# Patient Record
Sex: Female | Born: 1935 | Race: White | Hispanic: No | Marital: Married | Smoking: Never smoker
Health system: Southern US, Community
[De-identification: ages and names within clinical notes are randomized; demographics above are authoritative.]

## PROBLEM LIST (undated history)

## (undated) DIAGNOSIS — H269 Unspecified cataract: Secondary | ICD-10-CM

## (undated) DIAGNOSIS — F419 Anxiety disorder, unspecified: Secondary | ICD-10-CM

## (undated) DIAGNOSIS — F99 Mental disorder, not otherwise specified: Secondary | ICD-10-CM

## (undated) DIAGNOSIS — G709 Myoneural disorder, unspecified: Secondary | ICD-10-CM

## (undated) DIAGNOSIS — M1990 Unspecified osteoarthritis, unspecified site: Secondary | ICD-10-CM

## (undated) DIAGNOSIS — R51 Headache: Secondary | ICD-10-CM

## (undated) HISTORY — DX: Myoneural disorder, unspecified: G70.9

## (undated) HISTORY — PX: SPINE SURGERY: SHX786

## (undated) HISTORY — DX: Unspecified cataract: H26.9

## (undated) HISTORY — PX: CATARACT EXTRACTION, BILATERAL: SHX1313

## (undated) HISTORY — PX: KNEE SURGERY: SHX244

---

## 1998-03-29 ENCOUNTER — Other Ambulatory Visit: Admission: RE | Admit: 1998-03-29 | Discharge: 1998-03-29 | Payer: Self-pay | Admitting: Obstetrics and Gynecology

## 1999-01-04 ENCOUNTER — Ambulatory Visit (HOSPITAL_COMMUNITY): Admission: RE | Admit: 1999-01-04 | Discharge: 1999-01-04 | Payer: Self-pay | Admitting: Neurosurgery

## 1999-01-04 ENCOUNTER — Encounter: Payer: Self-pay | Admitting: Neurosurgery

## 1999-01-11 ENCOUNTER — Ambulatory Visit (HOSPITAL_COMMUNITY): Admission: RE | Admit: 1999-01-11 | Discharge: 1999-01-11 | Payer: Self-pay | Admitting: Neurosurgery

## 1999-02-20 ENCOUNTER — Ambulatory Visit (HOSPITAL_COMMUNITY): Admission: RE | Admit: 1999-02-20 | Discharge: 1999-02-20 | Payer: Self-pay | Admitting: Obstetrics and Gynecology

## 1999-02-20 ENCOUNTER — Encounter: Payer: Self-pay | Admitting: Obstetrics and Gynecology

## 1999-04-02 ENCOUNTER — Other Ambulatory Visit: Admission: RE | Admit: 1999-04-02 | Discharge: 1999-04-02 | Payer: Self-pay | Admitting: Obstetrics and Gynecology

## 2000-06-29 ENCOUNTER — Encounter: Payer: Self-pay | Admitting: Obstetrics and Gynecology

## 2000-06-29 ENCOUNTER — Ambulatory Visit (HOSPITAL_COMMUNITY): Admission: RE | Admit: 2000-06-29 | Discharge: 2000-06-29 | Payer: Self-pay | Admitting: Obstetrics and Gynecology

## 2000-07-02 ENCOUNTER — Encounter: Admission: RE | Admit: 2000-07-02 | Discharge: 2000-07-02 | Payer: Self-pay | Admitting: Obstetrics and Gynecology

## 2000-07-02 ENCOUNTER — Encounter: Payer: Self-pay | Admitting: Obstetrics and Gynecology

## 2000-07-09 ENCOUNTER — Other Ambulatory Visit: Admission: RE | Admit: 2000-07-09 | Discharge: 2000-07-09 | Payer: Self-pay | Admitting: Obstetrics and Gynecology

## 2000-07-20 ENCOUNTER — Encounter (INDEPENDENT_AMBULATORY_CARE_PROVIDER_SITE_OTHER): Payer: Self-pay | Admitting: Specialist

## 2000-07-20 ENCOUNTER — Ambulatory Visit (HOSPITAL_COMMUNITY): Admission: RE | Admit: 2000-07-20 | Discharge: 2000-07-20 | Payer: Self-pay | Admitting: Surgery

## 2001-08-26 ENCOUNTER — Encounter: Payer: Self-pay | Admitting: Surgery

## 2001-08-26 ENCOUNTER — Encounter: Admission: RE | Admit: 2001-08-26 | Discharge: 2001-08-26 | Payer: Self-pay | Admitting: Surgery

## 2001-08-27 ENCOUNTER — Ambulatory Visit (HOSPITAL_COMMUNITY): Admission: RE | Admit: 2001-08-27 | Discharge: 2001-08-27 | Payer: Self-pay | Admitting: Obstetrics and Gynecology

## 2001-08-27 ENCOUNTER — Encounter: Payer: Self-pay | Admitting: Obstetrics and Gynecology

## 2001-09-30 ENCOUNTER — Other Ambulatory Visit: Admission: RE | Admit: 2001-09-30 | Discharge: 2001-09-30 | Payer: Self-pay | Admitting: Obstetrics and Gynecology

## 2002-12-30 ENCOUNTER — Emergency Department (HOSPITAL_COMMUNITY): Admission: EM | Admit: 2002-12-30 | Discharge: 2002-12-30 | Payer: Self-pay | Admitting: Emergency Medicine

## 2003-01-02 ENCOUNTER — Encounter: Payer: Self-pay | Admitting: Neurosurgery

## 2003-01-02 ENCOUNTER — Ambulatory Visit (HOSPITAL_COMMUNITY): Admission: RE | Admit: 2003-01-02 | Discharge: 2003-01-02 | Payer: Self-pay | Admitting: Neurosurgery

## 2003-01-17 ENCOUNTER — Inpatient Hospital Stay (HOSPITAL_COMMUNITY): Admission: RE | Admit: 2003-01-17 | Discharge: 2003-01-19 | Payer: Self-pay | Admitting: Neurosurgery

## 2003-01-17 ENCOUNTER — Encounter: Payer: Self-pay | Admitting: Neurosurgery

## 2003-03-20 ENCOUNTER — Encounter: Admission: RE | Admit: 2003-03-20 | Discharge: 2003-03-20 | Payer: Self-pay | Admitting: Neurosurgery

## 2003-03-20 ENCOUNTER — Encounter: Payer: Self-pay | Admitting: Neurosurgery

## 2003-03-20 ENCOUNTER — Encounter: Payer: Self-pay | Admitting: Radiology

## 2003-03-31 ENCOUNTER — Encounter: Payer: Self-pay | Admitting: Neurosurgery

## 2003-03-31 ENCOUNTER — Encounter: Admission: RE | Admit: 2003-03-31 | Discharge: 2003-03-31 | Payer: Self-pay | Admitting: Neurosurgery

## 2003-05-01 ENCOUNTER — Encounter: Payer: Self-pay | Admitting: Neurosurgery

## 2003-05-01 ENCOUNTER — Encounter: Admission: RE | Admit: 2003-05-01 | Discharge: 2003-05-01 | Payer: Self-pay | Admitting: Neurosurgery

## 2003-06-18 ENCOUNTER — Ambulatory Visit (HOSPITAL_COMMUNITY): Admission: RE | Admit: 2003-06-18 | Discharge: 2003-06-18 | Payer: Self-pay | Admitting: Neurosurgery

## 2003-06-18 ENCOUNTER — Encounter: Payer: Self-pay | Admitting: Neurosurgery

## 2003-06-23 ENCOUNTER — Encounter: Payer: Self-pay | Admitting: Neurosurgery

## 2003-06-23 ENCOUNTER — Inpatient Hospital Stay (HOSPITAL_COMMUNITY): Admission: RE | Admit: 2003-06-23 | Discharge: 2003-06-29 | Payer: Self-pay | Admitting: Neurosurgery

## 2003-10-31 ENCOUNTER — Other Ambulatory Visit: Admission: RE | Admit: 2003-10-31 | Discharge: 2003-10-31 | Payer: Self-pay | Admitting: Obstetrics and Gynecology

## 2003-12-14 ENCOUNTER — Encounter: Admission: RE | Admit: 2003-12-14 | Discharge: 2003-12-14 | Payer: Self-pay | Admitting: Geriatric Medicine

## 2005-04-07 ENCOUNTER — Ambulatory Visit (HOSPITAL_COMMUNITY): Admission: RE | Admit: 2005-04-07 | Discharge: 2005-04-07 | Payer: Self-pay | Admitting: Neurosurgery

## 2005-04-25 ENCOUNTER — Encounter: Admission: RE | Admit: 2005-04-25 | Discharge: 2005-04-25 | Payer: Self-pay | Admitting: Neurosurgery

## 2005-05-19 ENCOUNTER — Encounter: Admission: RE | Admit: 2005-05-19 | Discharge: 2005-05-19 | Payer: Self-pay | Admitting: Neurosurgery

## 2005-06-18 ENCOUNTER — Encounter: Admission: RE | Admit: 2005-06-18 | Discharge: 2005-06-18 | Payer: Self-pay | Admitting: Neurosurgery

## 2005-08-14 ENCOUNTER — Encounter: Admission: RE | Admit: 2005-08-14 | Discharge: 2005-08-14 | Payer: Self-pay | Admitting: Neurosurgery

## 2005-10-02 ENCOUNTER — Encounter
Admission: RE | Admit: 2005-10-02 | Discharge: 2005-12-31 | Payer: Self-pay | Admitting: Physical Medicine & Rehabilitation

## 2005-10-02 ENCOUNTER — Ambulatory Visit: Payer: Self-pay | Admitting: Physical Medicine & Rehabilitation

## 2005-12-26 ENCOUNTER — Encounter
Admission: RE | Admit: 2005-12-26 | Discharge: 2006-03-26 | Payer: Self-pay | Admitting: Physical Medicine & Rehabilitation

## 2005-12-26 ENCOUNTER — Ambulatory Visit: Payer: Self-pay | Admitting: Physical Medicine & Rehabilitation

## 2006-01-30 ENCOUNTER — Ambulatory Visit: Payer: Self-pay | Admitting: Physical Medicine & Rehabilitation

## 2006-02-27 ENCOUNTER — Ambulatory Visit: Payer: Self-pay | Admitting: Physical Medicine & Rehabilitation

## 2006-03-27 ENCOUNTER — Encounter
Admission: RE | Admit: 2006-03-27 | Discharge: 2006-04-06 | Payer: Self-pay | Admitting: Physical Medicine & Rehabilitation

## 2006-04-15 ENCOUNTER — Ambulatory Visit: Payer: Self-pay | Admitting: Physical Medicine & Rehabilitation

## 2006-04-15 ENCOUNTER — Encounter
Admission: RE | Admit: 2006-04-15 | Discharge: 2006-07-14 | Payer: Self-pay | Admitting: Physical Medicine & Rehabilitation

## 2006-05-27 ENCOUNTER — Ambulatory Visit: Payer: Self-pay | Admitting: Physical Medicine & Rehabilitation

## 2006-07-03 ENCOUNTER — Ambulatory Visit: Payer: Self-pay | Admitting: Physical Medicine & Rehabilitation

## 2006-09-02 ENCOUNTER — Ambulatory Visit: Payer: Self-pay | Admitting: Physical Medicine & Rehabilitation

## 2006-09-02 ENCOUNTER — Encounter
Admission: RE | Admit: 2006-09-02 | Discharge: 2006-12-01 | Payer: Self-pay | Admitting: Physical Medicine & Rehabilitation

## 2006-10-29 ENCOUNTER — Ambulatory Visit: Payer: Self-pay | Admitting: Physical Medicine and Rehabilitation

## 2006-10-29 ENCOUNTER — Encounter
Admission: RE | Admit: 2006-10-29 | Discharge: 2007-01-27 | Payer: Self-pay | Admitting: Physical Medicine and Rehabilitation

## 2006-11-25 ENCOUNTER — Encounter
Admission: RE | Admit: 2006-11-25 | Discharge: 2007-02-23 | Payer: Self-pay | Admitting: Physical Medicine & Rehabilitation

## 2006-12-14 ENCOUNTER — Ambulatory Visit: Payer: Self-pay | Admitting: Physical Medicine & Rehabilitation

## 2007-03-10 ENCOUNTER — Encounter
Admission: RE | Admit: 2007-03-10 | Discharge: 2007-06-08 | Payer: Self-pay | Admitting: Physical Medicine & Rehabilitation

## 2007-03-11 ENCOUNTER — Ambulatory Visit: Payer: Self-pay | Admitting: Physical Medicine & Rehabilitation

## 2007-06-03 ENCOUNTER — Ambulatory Visit: Payer: Self-pay | Admitting: Physical Medicine & Rehabilitation

## 2007-06-30 ENCOUNTER — Encounter
Admission: RE | Admit: 2007-06-30 | Discharge: 2007-09-28 | Payer: Self-pay | Admitting: Physical Medicine & Rehabilitation

## 2007-07-26 ENCOUNTER — Ambulatory Visit: Payer: Self-pay | Admitting: Physical Medicine & Rehabilitation

## 2007-08-03 ENCOUNTER — Ambulatory Visit (HOSPITAL_BASED_OUTPATIENT_CLINIC_OR_DEPARTMENT_OTHER): Admission: RE | Admit: 2007-08-03 | Discharge: 2007-08-03 | Payer: Self-pay | Admitting: Surgery

## 2007-08-03 ENCOUNTER — Encounter (INDEPENDENT_AMBULATORY_CARE_PROVIDER_SITE_OTHER): Payer: Self-pay | Admitting: Surgery

## 2007-10-22 ENCOUNTER — Ambulatory Visit: Payer: Self-pay | Admitting: Physical Medicine & Rehabilitation

## 2007-10-22 ENCOUNTER — Encounter
Admission: RE | Admit: 2007-10-22 | Discharge: 2008-01-20 | Payer: Self-pay | Admitting: Physical Medicine & Rehabilitation

## 2007-12-23 ENCOUNTER — Ambulatory Visit: Payer: Self-pay | Admitting: Physical Medicine & Rehabilitation

## 2007-12-29 ENCOUNTER — Encounter
Admission: RE | Admit: 2007-12-29 | Discharge: 2008-03-28 | Payer: Self-pay | Admitting: Physical Medicine & Rehabilitation

## 2008-01-21 ENCOUNTER — Ambulatory Visit: Payer: Self-pay | Admitting: Physical Medicine & Rehabilitation

## 2008-02-17 ENCOUNTER — Ambulatory Visit: Payer: Self-pay | Admitting: Physical Medicine & Rehabilitation

## 2008-03-27 ENCOUNTER — Encounter: Admission: RE | Admit: 2008-03-27 | Discharge: 2008-03-27 | Payer: Self-pay | Admitting: Rheumatology

## 2008-04-24 ENCOUNTER — Encounter: Admission: RE | Admit: 2008-04-24 | Discharge: 2008-04-24 | Payer: Self-pay | Admitting: Obstetrics and Gynecology

## 2008-05-11 ENCOUNTER — Encounter
Admission: RE | Admit: 2008-05-11 | Discharge: 2008-07-06 | Payer: Self-pay | Admitting: Physical Medicine & Rehabilitation

## 2008-05-12 ENCOUNTER — Ambulatory Visit: Payer: Self-pay | Admitting: Physical Medicine & Rehabilitation

## 2008-05-18 ENCOUNTER — Encounter: Admission: RE | Admit: 2008-05-18 | Discharge: 2008-05-18 | Payer: Self-pay | Admitting: Orthopedic Surgery

## 2008-05-23 ENCOUNTER — Ambulatory Visit (HOSPITAL_BASED_OUTPATIENT_CLINIC_OR_DEPARTMENT_OTHER): Admission: RE | Admit: 2008-05-23 | Discharge: 2008-05-23 | Payer: Self-pay | Admitting: Orthopedic Surgery

## 2008-07-06 ENCOUNTER — Ambulatory Visit: Payer: Self-pay | Admitting: Physical Medicine & Rehabilitation

## 2008-08-03 ENCOUNTER — Encounter
Admission: RE | Admit: 2008-08-03 | Discharge: 2008-11-01 | Payer: Self-pay | Admitting: Physical Medicine & Rehabilitation

## 2008-08-07 ENCOUNTER — Ambulatory Visit: Payer: Self-pay | Admitting: Physical Medicine & Rehabilitation

## 2008-09-05 ENCOUNTER — Ambulatory Visit: Payer: Self-pay | Admitting: Physical Medicine & Rehabilitation

## 2008-10-02 ENCOUNTER — Ambulatory Visit: Payer: Self-pay | Admitting: Physical Medicine & Rehabilitation

## 2008-10-30 ENCOUNTER — Encounter
Admission: RE | Admit: 2008-10-30 | Discharge: 2009-01-28 | Payer: Self-pay | Admitting: Physical Medicine & Rehabilitation

## 2008-10-31 ENCOUNTER — Ambulatory Visit: Payer: Self-pay | Admitting: Physical Medicine & Rehabilitation

## 2008-11-29 ENCOUNTER — Ambulatory Visit: Payer: Self-pay | Admitting: Physical Medicine & Rehabilitation

## 2008-12-26 ENCOUNTER — Ambulatory Visit: Payer: Self-pay | Admitting: Physical Medicine & Rehabilitation

## 2009-03-06 ENCOUNTER — Encounter
Admission: RE | Admit: 2009-03-06 | Discharge: 2009-06-04 | Payer: Self-pay | Admitting: Physical Medicine & Rehabilitation

## 2009-03-08 ENCOUNTER — Ambulatory Visit: Payer: Self-pay | Admitting: Physical Medicine & Rehabilitation

## 2009-04-06 ENCOUNTER — Ambulatory Visit: Payer: Self-pay | Admitting: Physical Medicine & Rehabilitation

## 2009-05-04 ENCOUNTER — Ambulatory Visit: Payer: Self-pay | Admitting: Physical Medicine & Rehabilitation

## 2009-06-05 ENCOUNTER — Encounter
Admission: RE | Admit: 2009-06-05 | Discharge: 2009-09-03 | Payer: Self-pay | Admitting: Physical Medicine & Rehabilitation

## 2009-06-11 ENCOUNTER — Ambulatory Visit: Payer: Self-pay | Admitting: Physical Medicine & Rehabilitation

## 2009-07-17 ENCOUNTER — Ambulatory Visit: Payer: Self-pay | Admitting: Physical Medicine & Rehabilitation

## 2009-07-21 ENCOUNTER — Observation Stay (HOSPITAL_COMMUNITY): Admission: EM | Admit: 2009-07-21 | Discharge: 2009-07-22 | Payer: Self-pay | Admitting: Emergency Medicine

## 2009-08-15 ENCOUNTER — Ambulatory Visit: Payer: Self-pay | Admitting: Physical Medicine & Rehabilitation

## 2009-09-11 ENCOUNTER — Ambulatory Visit: Payer: Self-pay | Admitting: Physical Medicine & Rehabilitation

## 2009-09-11 ENCOUNTER — Encounter
Admission: RE | Admit: 2009-09-11 | Discharge: 2009-10-18 | Payer: Self-pay | Admitting: Physical Medicine & Rehabilitation

## 2009-10-10 ENCOUNTER — Ambulatory Visit: Payer: Self-pay | Admitting: Physical Medicine & Rehabilitation

## 2009-10-18 ENCOUNTER — Ambulatory Visit: Payer: Self-pay | Admitting: Physical Medicine & Rehabilitation

## 2009-11-13 ENCOUNTER — Encounter
Admission: RE | Admit: 2009-11-13 | Discharge: 2010-02-11 | Payer: Self-pay | Admitting: Physical Medicine & Rehabilitation

## 2009-11-23 ENCOUNTER — Ambulatory Visit: Payer: Self-pay | Admitting: Physical Medicine & Rehabilitation

## 2009-12-21 ENCOUNTER — Ambulatory Visit: Payer: Self-pay | Admitting: Physical Medicine & Rehabilitation

## 2010-01-30 ENCOUNTER — Ambulatory Visit: Payer: Self-pay | Admitting: Physical Medicine & Rehabilitation

## 2010-02-25 ENCOUNTER — Encounter
Admission: RE | Admit: 2010-02-25 | Discharge: 2010-05-26 | Payer: Self-pay | Admitting: Physical Medicine & Rehabilitation

## 2010-02-27 ENCOUNTER — Ambulatory Visit: Payer: Self-pay | Admitting: Physical Medicine & Rehabilitation

## 2010-03-22 ENCOUNTER — Ambulatory Visit: Payer: Self-pay | Admitting: Physical Medicine & Rehabilitation

## 2010-04-17 ENCOUNTER — Ambulatory Visit: Payer: Self-pay | Admitting: Physical Medicine & Rehabilitation

## 2010-05-15 ENCOUNTER — Ambulatory Visit: Payer: Self-pay | Admitting: Physical Medicine & Rehabilitation

## 2010-05-30 ENCOUNTER — Encounter
Admission: RE | Admit: 2010-05-30 | Discharge: 2010-08-28 | Payer: Self-pay | Admitting: Physical Medicine & Rehabilitation

## 2010-06-06 ENCOUNTER — Ambulatory Visit: Payer: Self-pay | Admitting: Physical Medicine & Rehabilitation

## 2010-07-03 ENCOUNTER — Ambulatory Visit: Payer: Self-pay | Admitting: Physical Medicine & Rehabilitation

## 2010-07-31 ENCOUNTER — Ambulatory Visit: Payer: Self-pay | Admitting: Physical Medicine & Rehabilitation

## 2010-08-30 ENCOUNTER — Encounter
Admission: RE | Admit: 2010-08-30 | Discharge: 2010-11-19 | Payer: Self-pay | Source: Home / Self Care | Attending: Physical Medicine & Rehabilitation | Admitting: Physical Medicine & Rehabilitation

## 2010-09-03 ENCOUNTER — Ambulatory Visit: Payer: Self-pay | Admitting: Physical Medicine & Rehabilitation

## 2010-09-30 ENCOUNTER — Ambulatory Visit: Payer: Self-pay | Admitting: Physical Medicine & Rehabilitation

## 2010-10-02 ENCOUNTER — Ambulatory Visit: Payer: Self-pay | Admitting: Physical Medicine & Rehabilitation

## 2010-10-28 ENCOUNTER — Encounter
Admission: RE | Admit: 2010-10-28 | Discharge: 2010-10-28 | Payer: Self-pay | Source: Home / Self Care | Attending: Obstetrics and Gynecology | Admitting: Obstetrics and Gynecology

## 2010-11-06 ENCOUNTER — Encounter
Admission: RE | Admit: 2010-11-06 | Discharge: 2010-11-19 | Payer: Self-pay | Source: Home / Self Care | Attending: Physical Medicine & Rehabilitation | Admitting: Physical Medicine & Rehabilitation

## 2010-11-11 ENCOUNTER — Encounter: Payer: Self-pay | Admitting: Obstetrics and Gynecology

## 2010-12-16 ENCOUNTER — Encounter: Payer: MEDICARE | Attending: Physical Medicine & Rehabilitation

## 2010-12-16 ENCOUNTER — Encounter (HOSPITAL_BASED_OUTPATIENT_CLINIC_OR_DEPARTMENT_OTHER): Payer: MEDICARE | Admitting: Physical Medicine & Rehabilitation

## 2010-12-16 DIAGNOSIS — M47817 Spondylosis without myelopathy or radiculopathy, lumbosacral region: Secondary | ICD-10-CM | POA: Insufficient documentation

## 2010-12-16 DIAGNOSIS — Z981 Arthrodesis status: Secondary | ICD-10-CM | POA: Insufficient documentation

## 2010-12-25 NOTE — Procedures (Signed)
Shirley Hamilton, Shirley Hamilton                ACCOUNT NO.:  [id number]  MEDICAL RECORD NO.:  [id number]           PATIENT TYPE:  LOCATION:                                 FACILITY:  PHYSICIAN:  Erick Colace, M.D.DATE OF BIRTH:  18-Jan-1936  DATE OF PROCEDURE:  12/16/2010 DATE OF DISCHARGE:                              OPERATIVE REPORT  PROCEDURE:  Right T12, L1, L2 medial branch radiofrequency neurotomy under fluoroscopic guidance.  INDICATIONS:  Lumbosacral spondylosis above the level of L4-5, L5-S1 fusions.  Pain is only partially relieved by medication management including narcotic analgesics and this raised 8/10.  Informed consent was obtained after describing the risks and benefits of the procedure with the patient.  These include bleeding, bruising and infection.  She elects to proceed and has given written consent.  The patient was placed prone on fluoroscopy table.  Betadine prep, sterile draped.  A 25-gauge 1-1/2-inch needle was used to anesthetize the skin and subcutaneous tissue with 1% lidocaine x2 mL at each of 3 sites. Then a 28-gauge 10-cm RF needle with 10-mm curved active tip was inserted under fluoroscopic guidance first targeting the L3 SAP transverse process junction, bone contact made confirmed with lateral imaging.  Sensory stem at 50 Hz followed by motor stem at 2 Hz confirmed proper needle location followed by injection of 1 mL of the dexamethasone-lidocaine solution and radiofrequency lesioning 70 degrees Celsius for 70 seconds.  Then the right L2 SAP transverse process junction targeted, bone contact made confirmed with lateral imaging. Sensory stem at 50 Hz followed by motor stem at 2 Hz confirmed proper needle location, followed by injection of 1 mL of a dexamethasone- lidocaine solution and radiofrequency lesioning at 70 degrees Celsius for 70 seconds.  Then the right L1 SAP transverse process junction targeted, bone contact made confirmed with lateral  imaging.  Sensory stem at 50 Hz followed by motor stem at 2 Hz confirmed proper needle location, followed by injection of 1 mL of dexamethasone-lidocaine solution and radiofrequency lesioning at 70 degrees Celsius for 70 seconds.  The patient tolerated the procedure well.  Pre- and post- injection and vitals stable.  Post-injection instructions given.     Erick Colace, M.D. Electronically Signed    AEK/MEDQ  D:  12/16/2010 12:29:26  T:  12/16/2010 20:31:47  Job:  [id number]

## 2010-12-26 ENCOUNTER — Ambulatory Visit (HOSPITAL_BASED_OUTPATIENT_CLINIC_OR_DEPARTMENT_OTHER): Payer: MEDICARE | Admitting: Physical Medicine & Rehabilitation

## 2010-12-26 ENCOUNTER — Encounter: Payer: MEDICARE | Attending: Physical Medicine & Rehabilitation

## 2010-12-26 DIAGNOSIS — M47817 Spondylosis without myelopathy or radiculopathy, lumbosacral region: Secondary | ICD-10-CM | POA: Insufficient documentation

## 2010-12-26 DIAGNOSIS — Z981 Arthrodesis status: Secondary | ICD-10-CM | POA: Insufficient documentation

## 2010-12-26 DIAGNOSIS — M961 Postlaminectomy syndrome, not elsewhere classified: Secondary | ICD-10-CM

## 2010-12-26 DIAGNOSIS — IMO0002 Reserved for concepts with insufficient information to code with codable children: Secondary | ICD-10-CM

## 2011-01-02 ENCOUNTER — Other Ambulatory Visit: Payer: Self-pay | Admitting: Physical Medicine & Rehabilitation

## 2011-01-02 DIAGNOSIS — M79651 Pain in right thigh: Secondary | ICD-10-CM

## 2011-01-02 DIAGNOSIS — M545 Low back pain: Secondary | ICD-10-CM

## 2011-01-08 ENCOUNTER — Ambulatory Visit
Admission: RE | Admit: 2011-01-08 | Discharge: 2011-01-08 | Disposition: A | Discharge status: Home / Self Care | Payer: MEDICARE | Source: Ambulatory Visit | Attending: Physical Medicine & Rehabilitation | Admitting: Physical Medicine & Rehabilitation

## 2011-01-08 DIAGNOSIS — M545 Low back pain: Secondary | ICD-10-CM

## 2011-01-08 DIAGNOSIS — M79651 Pain in right thigh: Secondary | ICD-10-CM

## 2011-01-08 MED ORDER — GADOBENATE DIMEGLUMINE 529 MG/ML IV SOLN
10.0000 mL | Freq: Once | INTRAVENOUS | Status: AC | PRN
  Administered 2011-01-08: 10 mL via INTRAVENOUS

## 2011-01-10 NOTE — Assessment & Plan Note (Signed)
REASON FOR VISIT:  Right thigh pain.  HISTORY:  A 75 year old female with prior history of L4-5, L5-S1 fusion. This was performed approximately 10 years ago.  Over the last month, she has developed increasing right thigh burning pain.  She describes her pain as worsening with laying down primarily.  In the past, she has had trochanteric bursitis which improved after injection.  She feels like this pain is on the same side but not in the same location.  She gets some relief from our oxycodone but not complete.  It does interfere with her sleep.  She needs assistance with meal prep, but is otherwise independent.  She has been helping her husband more now that he has been debilitated from Parkinson disease.  REVIEW OF SYSTEMS:  As noted above, numbness and tingling, right thigh, she has this pain when attempting to sleep.  SOCIAL HISTORY:  Married, lives with her husband.  Drinks alcohol about 2 times per week.  PHYSICAL EXAMINATION:  GENERAL:  No acute distress.  Mood and affect appropriate. MUSCULOSKELETAL:  She has normal gait pattern.  Her deep tendon reflexes are normal, bilateral patella and Achilles.  She has negative femoral stretch test. Negative straight leg raising test.  She has decreased sensation on the lateral aspect of her thigh but this is not where her pain is.  She has no decreased sensation along the anterior or medial area of the thigh.  Her back has some tenderness to range of motion.  IMPRESSION: 1. Right thigh pain, burning, appears to be neuropathic in nature.     Differential includes L3 radiculopathy, meralgia paresthetica.  I     examined her hip.  She really does not have any tenderness over the     trochanteric bursa, so I really think there is no reason to inject     it today.  PLAN: 1. We will set her up for MRI, it has at least 7 years since her last     MRI. 2. Initiate gabapentin.  Given that this burning pain only bothers her     at night, we  will start at bedtime only.  Workup from 100 to 300 at     bedtime.  I will see her back in about 3 weeks to go over the MRI     results, possible epidural steroid injection at that time.     Discussed with the patient, agrees with plan.     Erick Colace, M.D. Electronically Signed    AEK/MedQ D:  12/26/2010 13:02:12  T:  12/26/2010 20:17:02  Job #:  [id number]  cc:   Hal T. Stoneking, M.D. Fax: [phone]

## 2011-01-16 ENCOUNTER — Encounter: Payer: MEDICARE | Admitting: Physical Medicine & Rehabilitation

## 2011-01-20 ENCOUNTER — Encounter (HOSPITAL_BASED_OUTPATIENT_CLINIC_OR_DEPARTMENT_OTHER): Payer: MEDICARE | Admitting: Physical Medicine & Rehabilitation

## 2011-01-20 ENCOUNTER — Encounter: Payer: MEDICARE | Attending: Physical Medicine & Rehabilitation

## 2011-01-20 DIAGNOSIS — M47817 Spondylosis without myelopathy or radiculopathy, lumbosacral region: Secondary | ICD-10-CM | POA: Insufficient documentation

## 2011-01-20 DIAGNOSIS — Z981 Arthrodesis status: Secondary | ICD-10-CM | POA: Insufficient documentation

## 2011-01-23 LAB — CBC
HCT: 40.3 % (ref 36.0–46.0)
Hemoglobin: 13.4 g/dL (ref 12.0–15.0)
MCHC: 33.2 g/dL (ref 30.0–36.0)
MCV: 98 fL (ref 78.0–100.0)
Platelets: 243 10*3/uL (ref 150–400)
RBC: 4.11 MIL/uL (ref 3.87–5.11)
RDW: 13 % (ref 11.5–15.5)
WBC: 9.4 10*3/uL (ref 4.0–10.5)

## 2011-01-23 LAB — COMPREHENSIVE METABOLIC PANEL
ALT: 20 U/L (ref 0–35)
AST: 23 U/L (ref 0–37)
Albumin: 3.1 g/dL — ABNORMAL LOW (ref 3.5–5.2)
Alkaline Phosphatase: 49 U/L (ref 39–117)
BUN: 4 mg/dL — ABNORMAL LOW (ref 6–23)
CO2: 25 mEq/L (ref 19–32)
Calcium: 8.4 mg/dL (ref 8.4–10.5)
Chloride: 106 mEq/L (ref 96–112)
Creatinine, Ser: 0.69 mg/dL (ref 0.4–1.2)
GFR calc Af Amer: >60 mL/min (ref >60)
GFR calc non Af Amer: >60 mL/min (ref >60)
Glucose, Bld: 207 mg/dL — ABNORMAL HIGH (ref 70–99)
Potassium: 3.6 mEq/L (ref 3.5–5.1)
Sodium: 139 mEq/L (ref 135–145)
Total Bilirubin: 0.4 mg/dL (ref 0.3–1.2)
Total Protein: 5.7 g/dL — ABNORMAL LOW (ref 6.0–8.3)

## 2011-01-23 LAB — SEDIMENTATION RATE: Sed Rate: 10 mm/hr (ref 0–22)

## 2011-02-24 ENCOUNTER — Encounter: Payer: MEDICARE | Attending: Physical Medicine & Rehabilitation

## 2011-02-24 ENCOUNTER — Ambulatory Visit: Payer: MEDICARE | Admitting: Physical Medicine & Rehabilitation

## 2011-02-24 ENCOUNTER — Ambulatory Visit (HOSPITAL_BASED_OUTPATIENT_CLINIC_OR_DEPARTMENT_OTHER): Payer: MEDICARE | Admitting: Physical Medicine & Rehabilitation

## 2011-02-24 DIAGNOSIS — Z981 Arthrodesis status: Secondary | ICD-10-CM | POA: Insufficient documentation

## 2011-02-24 DIAGNOSIS — IMO0002 Reserved for concepts with insufficient information to code with codable children: Secondary | ICD-10-CM

## 2011-02-24 DIAGNOSIS — M47817 Spondylosis without myelopathy or radiculopathy, lumbosacral region: Secondary | ICD-10-CM | POA: Insufficient documentation

## 2011-02-25 NOTE — Procedures (Signed)
NAMEKATERI, BALCH                ACCOUNT NO.:  [id number]  MEDICAL RECORD NO.:  [id number]           PATIENT TYPE:  LOCATION:                                 FACILITY:  PHYSICIAN:  Erick Colace, M.D.DATE OF BIRTH:  1936-10-07  DATE OF PROCEDURE:  02/24/2011 DATE OF DISCHARGE:                              OPERATIVE REPORT  PROCEDURE:  Right L3-4 transforaminal lumbar epidural steroid injection under fluoroscopic guidance.  INDICATIONS:  Right thigh pain mainly when trying to sleep, only partially relieved by medication management and other conservative care.  Informed consent was obtained after describing risks and benefits of the procedure with the patient.  These include bleeding, bruising and infection.  She elects to proceed and has given written consent.  The patient was placed prone on fluoroscopy table.  Betadine prep, sterile drape, 25-gauge inch and half needle was used to anesthetize the skin and subcutaneous tissue, 1% lidocaine x2 mL, then a 22-gauge three and half spinal needle was inserted into right L3-4 intervertebral foramen under AP, lateral, and oblique imaging.  Omnipaque 180 under live fluoro demonstrated good epidural and nerve root outline followed by injection of 1 mL of 10 mg/mL of dexamethasone and 2 mL of 1% MPF lidocaine.  The patient tolerated the procedure well.  Postprocedure instructions were given.  May reinject in 1 month if only short-term result, we will continue her gabapentin.  Can boost it up to 300 mg at night.  I discussed with the patient and agrees with plan.     Erick Colace, M.D. Electronically Signed    AEK/MEDQ  D:  02/24/2011 14:52:09  T:  02/25/2011 02:14:30  Job:  [id number]

## 2011-03-04 NOTE — Assessment & Plan Note (Signed)
Shirley Hamilton returns today.  She is a 75 year old female with history of lumbar  post laminectomy syndrome.  She has had good results with radiofrequency  neurotomy above the level of her L4-5 fusion.  Her last radiofrequency  neurotomy of the lumbar was done on August 07, 2008.  She has had in  the past approximately 1 year result with these procedures.  She has had  no worsening of her pain.  She has an Oswestry score of 44% today and  flares to 48% on Mar 08, 2009, with no significant difference.  No new  medical issues in the intercurrent time.   CURRENT PAIN MEDICATIONS:  1. Oxycodone 5 mg p.o. t.i.d.  2. She uses Voltaren gel as needed.  3. Celebrex on bad days, not taking this every day.  4. She also takes Flexeril 5 mg t.i.d. p.r.n.  5. Diazepam 5 mg b.i.d. on a regular basis.   PHYSICAL EXAMINATION:  Her back has no tenderness to palpation.  She has  good forward flexion and extension.  Extension does cause a bit more  pain than forward flexion.  Her lower extremity strength is normal.  Gait is normal.   IMPRESSION:  Lumbar post laminectomy syndrome.   PLAN:  We will continue her current medicines which include oxycodone 5  mg t.i.d., prescription for 90 tablets given today and diazepam 5 mg  p.o. b.i.d., #60 given today.  I will see her back in about 3 months,  nursing visit in 1 month.  When I see her next, it will be about 1 year  post last RF, may need to schedule for repeat at that time depending on  symptomatology.      Erick Colace, M.D.  Electronically Signed     AEK/MedQ  D:  06/11/2009 13:59:18  T:  06/12/2009 06:15:08  Job #:  [id number]

## 2011-03-04 NOTE — Assessment & Plan Note (Signed)
Mrs. Dines returns today.  She had a bilateral sacroiliac injection  under fluoroscopic guidance on December 30, 2007.  It was not as helpful as  the prior injection performed on October 28, 2007; however, she has had  increased activity level per her report.  Her friend's husband died and  she has been helping her friend clean up the house and do other more  physical duties.   Her pain level is about an 8/10.  She states it is a little higher up,  however, than her buttocks pain that she used to have prior the SI  injections.  Her pain interferes with meal prep, household duties, and  shopping.  She takes Percodan one p.o. t.i.d.  This has been helpful for  her.  She can walk 15 minutes at a time.  She climbs steps.  She drives.  Relief from meds is good.   SOCIAL HISTORY:  Married, lives with her husband, drinks a glass of wine  per day.   PHYSICAL EXAMINATION:  VITAL SIGNS:  Her blood pressure is 116/61, pulse  77, respiratory rate is 18, O2 sat 96% on room air.  MENTAL STATUS:  Affect is alert.  Oriented x3.  She is overweight.  MUSCULOSKELETAL:  Gait is normal.  She has tenderness in the lumbar  paraspinal muscles below the level of fusion, more with extension than  with flexion.  Her lower extremity strength is normal.  Deep tendon  reflexes are 1 plus bilateral knees and ankles.  Her hip range of motion  is normal.  Knee and ankle range of motion are normal.  No tenderness to  palpation of the lower extremities.   IMPRESSION:  Lumbar spondylosis, facet syndrome above level of fusion.  She has had good relief with prior T12, L1-L2 medial branch blocks and  radiofrequency neurotomy.  She is 7 months post on the right and 6  months post procedure on the left.  It may be starting to wear off.   I will see her back in 1 month, reassess.  If progressive worsening  reschedule RF.  Consider increasing lesion and temperature to 80 and 80.      Erick Colace, M.D.  Electronically Signed     AEK/MedQ  D:  01/21/2008 16:42:32  T:  01/21/2008 17:59:42  Job #:  [id number]   cc:   Casimiro Needle L. Thad Ranger, M.D.  Fax: [phone]   Hal T. Stoneking, M.D.  Fax: [phone]

## 2011-03-04 NOTE — Procedures (Signed)
NAMEJAYEL, Shirley Hamilton                ACCOUNT NO.:  [id number]   MEDICAL RECORD NO.:  [id number]          PATIENT TYPE:  REC   LOCATION:  [facility]                          FACILITY:  [facility]   PHYSICIAN:  Erick Colace, M.D.DATE OF BIRTH:  08-02-1936   DATE OF PROCEDURE:  07/01/2007  DATE OF DISCHARGE:                               OPERATIVE REPORT   PROCEDURE:  T12, L1, L2 medial branch blocks and radiofrequency  neurotomy under fluoroscopic guidance.   Informed consent was obtained after describing risks and benefits of the  procedure to the patient.  These include bleeding, bruising, infection,  loss of bowel or bladder function, temporary or permanent paralysis.  She elects to proceed.   INDICATIONS:  Lumbar facet mediated pain status post L4-5, L5-S1 fusion  with facet degeneration above the level of fusion.  She has had  increased activity level and functioning after medial branch blocks  performed in July and August 2007 now worn off.   The patient placed prone on fluoroscopy table.  Betadine prep, sterile  drape, 25-gauge inch and a half needle was used to incise skin and subcu  tissue, 1% lidocaine x2 mL.  Then a 20-gauge 10 cm RF needle with 10 mm  active tip was inserted under fluoroscopic guidance, first targeting the  L3 SAP transverse process junction bone contact made confirmed with  lateral imaging.  Motor stim x3 volts demonstrated no lower extremity  twitch followed by injection of 1 mL of a solution containing 4 mg/mL  Depo-Medrol 1 mL and 2 mL of 1% MPF lidocaine. Then RF lesioning  performed at 70 degrees and went for 45 seconds, then started  complaining of some burning pain in the hip area, switched to pulsed RF  x70 seconds and the patient completed this without difficulty.  Then the  right L2 SAP transverse process junction targeted, bone contact made  confirmed with lateral imaging.  Motor stim x3 volts demonstrated no  lower extremity twitch followed by  injection of 1 mL of the  dexamethasone lidocaine solution and RF lesioning 7 degrees x70 seconds.  Then the right L1 SAP transverse process junction targeted, bone contact  made confirmed with lateral imaging.  Motor stim x3 volts demonstrated  no lower extremity twitch followed by RF lesioning 70 degrees  x70  seconds.  The patient tolerated the procedure well.  Post injection  instructions given.      Erick Colace, M.D.  Electronically Signed     AEK/MEDQ  D:  07/01/2007 13:56:36  T:  07/02/2007 16:10:96  Job:  [id number]

## 2011-03-04 NOTE — Assessment & Plan Note (Signed)
Ms. Deak returns today.  She has done reasonably well over the last  month or so.  She does have a complaint of right hip burning type of  discomfort at times and feeling that her hip is going to go out on  her.  This is intermittent.  She has had no fall or any other new  medical problems.   Her pain is 8 to 9.  Interferes with activity at a moderate level.  Pain  is worse with bending, sitting, standing.  Improves with rest and  medications.  She can walk 15 to 20 minutes at a time, climb steps.  She  drives.   REVIEW OF SYSTEMS:  Positive for poor sleep, depression.  She has had  some right lower extremity swelling and plans to see Dr. Pete Glatter for  this.   CURRENT MEDICATIONS:  Include Percodan 1 p.o. t.i.d.  She would like to  switch back to the oxycodone as her pharmacy states that they have it in  stock now.   Blood pressure is 121/57, pulse 93, respiratory rate 18, O2 saturation  96% on room air.  GENERAL:  No acute distress.  She does look a bit tired, but otherwise  at baseline.  Her hip range of motion normal.  Knee and ankle range of  motion normal.  She has good lumbar range of motion.  Mild tenderness to  palpation below the level of the fusion more than with extension.   IMPRESSION:  Lumbar spondylosis facet syndrome above level of fusion.  The last radiofrequency may be staring to wear off, although it is a bit  early to say.   I will see her back in about three months.  If she has progressive  worsening in that period of time, I will schedule her for another  radiofrequency.  We will switch her back to oxycodone 5 mg t.i.d. from  the Percodan, which is basically oxycodone plus aspirin.  We will  continue diazepam 5 mg b.i.d.      Erick Colace, M.D.  Electronically Signed     AEK/MedQ  D:  02/17/2008 17:02:13  T:  02/17/2008 17:50:58  Job #:  [id number]   cc:   Hal T. Stoneking, M.D.  Fax: [phone]

## 2011-03-04 NOTE — Assessment & Plan Note (Signed)
Ms. Waiters returns today.  We did bilateral SI injections October 28, 2007.  She has had good relief of pain.  She has a history of sacroiliac  pain status post lumbar effusion L4 to L5.  She has had good results  with L1-2, T12, L1, L2 medial branch blocks and radiofrequency  neurotomies.   The pain is about 7-8/10, dull, constant, but she can stand for a much  longer period of time.  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   Her blood pressure is 115/64, pulse 79, respirations 18, O2 sat 87% on  room air.  An overweight female in no acute distress.  Orientation x3.  Affect is alert.  Gait is normal.  Her lumbar spine range of motion is  normal forward flexion.  Her lumbar extension is 75%, mainly coming from  the upper lumbar area.  She has normal hip internal and external  rotation, knee and ankle range of motion.  Normal lower extremity  strength.   Her mood and affect are appropriate, bright and alert.   IMPRESSION:  1. Lumbar post laminectomy syndrome.  2. Sacroiliac joint disorder.   PLAN:  1. Continue her oxycodone 5 mg t.i.d.  2. Continue diazepam 5 mg b.i.d. p.r.n.  She actually takes this less      often than twice a day and helpful for her muscle spasms.  3. I will see her back in 1 month.      Erick Colace, M.D.  Electronically Signed     AEK/MedQ  D:  11/26/2007 16:57:03  T:  11/28/2007 17:10:51  Job #:  [id number]

## 2011-03-04 NOTE — Procedures (Signed)
NAMEKRYSTALLE, Shirley Hamilton                ACCOUNT NO.:  [id number]   MEDICAL RECORD NO.:  [id number]          PATIENT TYPE:  REC   LOCATION:  [facility]                          FACILITY:  [facility]   PHYSICIAN:  Erick Colace, M.D.DATE OF BIRTH:  1936/03/27   DATE OF PROCEDURE:  10/28/2007  DATE OF DISCHARGE:                               OPERATIVE REPORT   This is bilateral sacroiliac injection under fluoroscopic guidance.   INDICATIONS:  Sacroiliac pain status post fusion lumbar 4-5.   Informed consent was obtained after describing risks and benefits of  procedure to the patient.  These include bleeding, bruising, infection,  loss of bowel bladder function.  Elected to proceed and has given  written consent.  The patient placed prone on fluoroscopy table.  Betadine prep, sterile drape 25-gauge 1-1/2 inch needle was used to  anesthetize skin and subcu tissue with 1% lidocaine x2 mL and a 25 gauge  3 inch spinal needle inserted first on the left side under AP lateral  and oblique imaging.  Omnipaque 180 x 0.5 mL demonstrated no  intravascular uptake.  Then a solution containing 0.5 mL of 40 mg Depo-  Medrol and 1 mL of 2% MPF lidocaine were injected.  Same procedure was  repeated on the right side using same technique, equipment and  injectate.  The patient tolerated procedure well.  Pre and post  injection vitals stable.  Post injection instructions given.  Return in  1 month.      Erick Colace, M.D.  Electronically Signed     AEK/MEDQ  D:  10/28/2007 16:08:14  T:  10/29/2007 06:27:32  Job:  [id number]

## 2011-03-04 NOTE — Assessment & Plan Note (Signed)
The patient returns today.  A 75 year old female with history of lumbar  fusion in the past.  She has had a fusion at L4-5 level.  She has had  some increasing pain in the lower back area in the past.  She has had  some relief with sacroiliac injections.  She has had a couple of falls.  She denies any radicular discomfort.  No pain in the upper lumbar area.  No lower extremity weakness.  No bowel or bladder dysfunction.   Her pain is at around 9/10 level.  She is able to walk about 30 minutes  at a time, she drives, she climbs steps.  She needs some assistance with  certain meal prep, household, and shopping activities.   SOCIAL HISTORY:  Married, lives with her husband.   PHYSICAL EXAMINATION:  Blood pressure 118/70, pulse 76, respirations 18,  and O2 sat 97% room air.  A well-developed, well-nourished female in no  acute distress.  Oriented x3.  Affect is labile.  Gait is normal.  She  is able to toe-walk and heel-walk.  Her lumbar spine range of motion is  good and forward flexion and extension is limited to about 50% range.  Lower extremity strength normal.  She has tenderness over the PSIS area,  left side greater than right side.   Lower extremity strength normal.  Deep tendon reflexes normal.   IMPRESSION:  Lumbar post-laminectomy syndrome.  Her pain actually  started before her falls.  I really do not think this is a traumatic  event.  Certainly way nothing to suggest cauda equina syndrome.  I  suspect that her pain is either a sacroiliac joint flare-up versus L5-S1  facet pain.  We will go ahead and do sacroiliac injection, falling this  may need to do L5-S1 intraarticular and failing this may need to be  imaged or sent back to Dr. Jeral Fruit.  In the meantime, we will also start  her on some Celebrex 1 p.o. b.i.d. for 1 day and then change to daily.  We will continue her oxycodone 5 mg t.i.d. as well as diazepam 5 b.i.d.      Erick Colace, M.D.  Electronically  Signed     AEK/MedQ  D:  12/26/2008 17:31:17  T:  12/27/2008 04:56:36  Job #:  [id number]   cc:   Hilda Lias, M.D.  Fax: [phone]

## 2011-03-04 NOTE — Assessment & Plan Note (Signed)
Ms. Winfree returns today, last seen by me on September 05, 2008.  She  underwent a left T12, L1, L2 medial branch radiofrequency neurotomy  under fluoroscopic guidance in October 2009.  She has had a right hip  injection, which was helpful, and performed last month.  She has been  quite active.  Her toe pain has been subsiding.  She had a bunionectomy  with some internal fixation using percutaneous pins.   She has had no new medical complications since I last saw her.   MEDICATIONS:  Oxycodone 5 mg, scheduled for q.i.d., but really taking  more like t.i.d.   SOCIAL HISTORY:  Married.  Lives with her husband.   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   Alcohol use, 0-2 ounces daily.  She states that it is 1 or 2 drinks 3  times a week on an average.   Her average pain is 8-9/10, dull and aching and appears with activity at  7-8/10 level.  The pain is worse with bending, sitting, and standing.  Improves with rest and medications.  Relief from meds is good.  She can  walk 30 minutes at a time.  She climbs steps.  She drives.   REVIEW OF SYSTEMS:  Otherwise, negative.   PHYSICAL EXAMINATION:  VITAL SIGNS:  Blood pressure 128/84, pulse 73,  respirations 18, and O2 saturation 100% on room air.  GENERAL:  A well-developed, well-nourished female, in no acute distress.  Orientation x3.  Affect is alert.  Gait is normal.   Extremities without edema.  She has normal lumbar flexion.  She has 75%  lumbar extension.   Lower extremity strength is full.  Hip range of motion is full.  Knee  and ankle range of motion are normal.   IMPRESSION:  1. Lumbar postlaminectomy syndrome.  2. Right trochanteric bursitis, improved.   PLAN:  1. Move back on her oxycodone to 5 mg t.i.d.  2. No need for repeat radiofrequency procedure at this point.  She had      a prior lumbar radiofrequency procedure lasting approximately 2      year.  3. I will see her back in approximately 1 months' time.  4.  We will also refill her diazepam 5 mg b.i.d., #60, no refills.      Erick Colace, M.D.  Electronically Signed     AEK/MedQ  D:  10/02/2008 12:57:20  T:  10/03/2008 08:04:44  Job #:  [id number]   cc:   Hal T. Stoneking, M.D.  Fax: [phone]

## 2011-03-04 NOTE — Assessment & Plan Note (Signed)
A 75 year old female with thoracic spondylosis above the level of L4-5.  She has had good success with lumbar medial branch blocks.   She has had no new medical problems.  She has had some success with her  sacroiliac injections as well for her buttocks pain.  She had mild  flareup when she went to [place] with her husband.  She was carrying a  lot of luggage.   REVIEW OF SYSTEMS:  She had some spasms from time to time.  Otherwise,  review of systems is negative.   SOCIAL HISTORY:  She is married.  She is retired.  Occasional alcohol.  She walks 30 minutes at a time.   Oswestry score 50%.   PHYSICAL EXAMINATION:  VITAL SIGNS:  Her blood pressure 115/70, pulse  87, respirations 18 and O2 sat 96% on room air.  GENERAL:  Well-developed, well-nourished female in no acute distress.  Orientation x3.  Affect is bright and alert.  Gait is normal.  EXTREMITIES:  Her back has no tenderness to palpation in the upper  lumbar area or in the PSIS area.  Her lower extremity strength is  normal.  Deep tendon reflexes are normal.  Gait is normal.   IMPRESSION:  Lumbar post-laminectomy syndrome with chronic postoperative  pain.  She has sacroiliac disorder which is also doing quite well status  post injection.   I will see her back in 2 months, nursing visit in 1 month.  Continue  oxycodone 5 mg p.o. t.i.d., random urine drug screens as per protocol.   No signs of aberrant drug behavior.      Erick Colace, M.D.  Electronically Signed     AEK/MedQ  D:  04/06/2009 16:52:14  T:  04/07/2009 11:08:26  Job #:  [id number]   cc:   Hilda Lias, M.D.  Fax: [phone]   Hal T. Stoneking, M.D.  Fax: [phone]

## 2011-03-04 NOTE — Procedures (Signed)
[name], Shirley Hamilton                ACCOUNT NO.:  [id number]   MEDICAL RECORD NO.:  [id number]          PATIENT TYPE:  REC   LOCATION:  [facility]                          FACILITY:  [facility]   PHYSICIAN:  Erick Colace, M.D.DATE OF BIRTH:  April 26, 1936   DATE OF PROCEDURE:  DATE OF DISCHARGE:                               OPERATIVE REPORT   PROCEDURE:  Bilateral sacroiliac injection under fluoroscopic guidance.   INDICATIONS:  Sacroiliac pain previously relieved about 2-1/2 months ago  from injection, bilateral sacroiliac.  Pain is inhibiting activities  such as prolonged sitting or standing.  It is only partially responsive  to medication management, and is graded as severe.   Informed consent was obtained after describing risks and benefits of the  procedure to the patient.  These include bleeding, bruising, infection,  loss of blood, loss bowel or bladder function, temporary or permanent  paralysis.  She elects to proceed and has given written consent.   DESCRIPTION OF PROCEDURE:  The patient was placed prone on the  fluoroscopy table.  Betadine prep, sterile drape.  A 25-gauge inch and  half needle was used to incise skin and subcu tissue, 1% lidocaine x2  mL.  Then a 25-gauge 3-inch spinal needle was inserted under  fluoroscopic guidance into the left SI joint.  AP, lateral and oblique  imaging utilized.  Omnipaque 180 x 0.5 mL demonstrated no intravascular  uptake.  Then 0.5 mL solution of Depo-Medrol 40 mg/cc and 1 mL of 2% MPF  lidocaine were injected.  The same was procedure was repeated on the  right side using same needle, injectate and procedure.  The patient  tolerated the procedure well.  Post injection instructions given.  Post  injection vitals stable.  Follow up in about1 month for outpatient  visit.      Erick Colace, M.D.  Electronically Signed     AEK/MEDQ  D:  12/30/2007 16:19:21  T:  12/31/2007 21:03:01  Job:  [id number]

## 2011-03-04 NOTE — Assessment & Plan Note (Signed)
Shirley Hamilton returns today, a 75 year old female with lumbar facet  syndrome. Improvement after repeat T12, L1-2 medial branch block and  radiofrequency neurotomy  under fluoroscopic guidance. She does have  some left-sided lower pain that is more noticeable at this point. She  had some increase in her medication usage over the holidays. Also was  more active. Constant aching pain about 8/10 on average. She can walk 15-  20 minutes at a time. Climbs steps, drives.   REVIEW OF SYSTEMS:  Positive for weight gain and depression.   PHYSICAL EXAMINATION:  Blood pressure 139/62, pulse 77, respirations 18,  O2 sat 97% on room air. Her back has tenderness at left PSIS. She has  full range of motion of lumbar spine in forward flexion. Extension is  about 75% range. She has full strength and range of motion of upper  extremities. Deep tendon reflexes are 1+ bilateral knees and ankles.   IMPRESSION:  1. Lumbar postlaminectomy syndrome with facet mediated pain L1-2, L2-3      level. She has L4-5 fusion.  2. Left hip and buttock pain. I believe that she has sacroiliac      arthropathy as well related to the fusion.   PLAN:  1. Will do sacroiliac injection.  2. Continue oxycodone 5 mg t.i.d. Have given her some samples of      Ambien CR 12.5 mg.   I will see her back after the injection in 1-2 weeks.      Erick Colace, M.D.  Electronically Signed     AEK/MedQ  D:  10/25/2007 13:57:23  T:  10/25/2007 14:33:22  Job #:  [id number]

## 2011-03-04 NOTE — Procedures (Signed)
NAMEREBECAH, DANGERFIELD                ACCOUNT NO.:  [id number]   MEDICAL RECORD NO.:  [id number]          PATIENT TYPE:  REC   LOCATION:  [facility]                          FACILITY:  [facility]   PHYSICIAN:  Erick Colace, M.D.DATE OF BIRTH:  Aug 19, 1936   DATE OF PROCEDURE:  03/08/2009  DATE OF DISCHARGE:                               OPERATIVE REPORT   This is bilateral sacroiliac joint injection under fluoroscopic  guidance.   INDICATIONS:  Sacroiliac disorder, status post lumbar post laminectomy  syndrome.  She has had some exacerbation of pain and pain is only  partially responsive to medication management and conservative care.  Interferes with mobility.   Informed consent was obtained after describing risks and benefits of the  procedure with the patient.  These include bleeding, bruising,  infection.  She elects to proceed and has given written consent.  The  patient placed prone on fluoroscopy table.  Betadine prep, sterile  drape, 25-gauge inch and half needle was used to anesthetize the skin  and subcu tissue.  A 1% lidocaine 2 mL and a 25-gauge 3-inch spinal  needle was inserted first into left SI joint.  AP, lateral, and oblique  imaging utilized.  Omnipaque 180 x 0.5 mL demonstrated good joint  outline followed by injection of 1 mL of 2% MPF lidocaine and 1-1/2 mL  of 40 mg/mL Depo-Medrol.  The same procedure was repeated on the right  side using same needle injectate and technique.  The patient tolerated  the procedure well.  Pre and post injection vitals stable.  Post  injection instructions given.      Erick Colace, M.D.  Electronically Signed     AEK/MEDQ  D:  03/08/2009 14:09:52  T:  03/09/2009 02:31:16  Job:  [id number]

## 2011-03-04 NOTE — Assessment & Plan Note (Signed)
Mr. Ralls returns today.  I saw her last December 14, 2006.  At that  time, she was following up after a T12, L1 and L2 medial branch block  and radiofrequency neurotomy performed on the left side on September 03, 2006, and on the right side May 28, 2006.  Her back pain has been much  improved since that time.  She has been started in physical therapy, and  has been progressing quite well with this.  She has not learned all of  her home exercise programs.  She would like to join [employer], and do  community-type exercise program, and would like to continue with PT so  she can learn this prior to starting on her own.   She has had no new medical complications in the interval time.  She has  had not aberrant drug behavior.  No early refills.  No running out  early.   CURRENT MEDICATIONS:  Include:  1. Oxycodone 5 mg t.i.d.  2. Flexeril 5 mg t.i.d.  3. Diazepam 5 mg b.i.d.   Her other non-pain medications include Lipitor, Premarin, Dramamine,  various vitamins and supplement, as well as Ambien.  She also gets  Imitrex from Dr. Thad Ranger.   EXAMINATION:  GENERAL:  In no acute distress.  Mood and affect  appropriate.  Her blood pressure is 123/62.  Pulse 86.  Respiratory rate 16.  Her O2  saturation 97% room air.  Her gait is normal.  Her forward flexion is 100%.  Her extension is 50%  to 75%, as is lateral bending and rotation.  Her motor strength is full  in the lower extremities.  Normal deep tendon reflexes.  She is able to  heel walk and toe walk.   IMPRESSION:  1. Lumbar facet syndrome, status post radiofrequency neurotomy.  2. Lumbar postlaminectomy syndrome.  Has a prior history of fusion at      L4/5.   PLAN:  1. Continue physical therapy, but reduce to 1 or 2 visits per week for      the next month so she can learn a community based exercise program.  2. I will see her back in 3 months.  3. Continue current medications, which include oxycodone 5 mg t.i.d.,  Flexeril 5 mg t.i.d., and diazepam 5 mg b.i.d.  She can pick up her      prescriptions in the interval time without nursing visit.      Erick Colace, M.D.  Electronically Signed     AEK/MedQ  D:  03/11/2007 15:19:33  T:  03/11/2007 16:28:50  Job #:  [id number]   cc:   Calla Kicks  [facility]  Fax [phone]   Marolyn Hammock. Thad Ranger, M.D.  Fax: [phone]

## 2011-03-04 NOTE — Op Note (Signed)
NAMETICIA, VIRGO                ACCOUNT NO.:  [id number]   MEDICAL RECORD NO.:  [id number]          PATIENT TYPE:  AMB   LOCATION:  [facility]                          FACILITY:  [facility]   PHYSICIAN:  Currie Paris, M.D.DATE OF BIRTH:  23-Jun-1936   DATE OF PROCEDURE:  08/03/2007  DATE OF DISCHARGE:  08/03/2007                               OPERATIVE REPORT   [facility]S [id number]   PREOPERATIVE DIAGNOSIS:  Skin lesion right forearm.   POSTOPERATIVE DIAGNOSIS:  Skin lesion right forearm.   OPERATION:  Excision skin lesion right forearm (1 cm incision).   CLINICAL HISTORY:  Ms. Postlethwait has a raised shiny small nodular density  that has remained on her right forearm for several months.  Clinically  suspicious for basal cell epithelioma.  She decided to have this  excised.   DESCRIPTION OF PROCEDURE:  The lesion was identified in the minor  procedure room.  We confirmed removal was the planned procedure.  The  area was anesthetized with 1% Xylocaine with epi.  After waiting five  minutes for the epi to take effect, I prepped the area with Betadine and  we made an elliptical incision.  I excised this just through the dermis.  The incision was closed with a single 4-0 Monocryl subcuticular.   The patient tolerated procedure well and there were no complications.  All counts were correct.      Currie Paris, M.D.  Electronically Signed     CJS/MEDQ  D:  08/03/2007  T:  08/04/2007  Job:  [id number]

## 2011-03-04 NOTE — Procedures (Signed)
Shirley Hamilton, Shirley Hamilton                ACCOUNT NO.:  [id number]   MEDICAL RECORD NO.:  [id number]         PATIENT TYPE:  HREC   LOCATION:                                 FACILITY:   PHYSICIAN:  Erick Colace, M.D.DATE OF BIRTH:  [birth year]   DATE OF PROCEDURE:  DATE OF DISCHARGE:                               OPERATIVE REPORT   PROCEDURE:  Right T12, L1-L2 medial branch radiofrequency neurotomy  under fluoroscopic guidance.   INDICATION:  Lumbar spondylosis without myelopathy and has had around 10  months improvement after radiofrequency procedure of the same  corresponding levels.  Her pain is only partially responsive to  medication management including narcotic and analgesic medications and  interferes with standing, walking, and lifting.   Informed consent was obtained after describing risks and benefits of the  procedure with the patient these include bleeding, bruising, infection,  temporary or permanent paralysis.  She elected to proceed and has given  written consent.  The patient placed prone on the fluoroscopy table.  Betadine prep, sterilely draped.  A 25-gauge 1-1/2 needle was used to  anesthetize the skin and subcu tissue, 1% lidocaine x2 mL, and 20-gauge  10-cm RF needle with 10-mm curved active tip was inserted under  fluoroscopic guidance first charting the right L3 SAP transverse process  junction.  Bone contact made, confirmed with lateral imaging, sensory  stem at 50 Hz followed by motor stem at 2 Hz, confirmed proper needle  location followed by injection of 1 mL of a solution containing 1 mL of  4 mg/mL dexamethasone, 2 mL of 2% lidocaine followed by radiofrequency  lesioning 7 degrees Celsius for 70 seconds.  Then, the right L2 SAP  transverse process junction targeted, bone contact made, confirmed with  lateral imaging, sensory stem at 50 Hz followed by motor stem at 2 Hz,  confirmed proper needle location followed by injection of 1 mL of the  dexamethasone/lidocaine solution and radiofrequency lesioning 7 degrees  Celsius for 70 seconds.  Then, the right L1 SAP transverse process  junction targeted, bone contact made, confirmed with lateral imaging,  sensory stem at 50 Hz followed by motor stem at 2 Hz, confirmed proper  needle location followed by injection of 1 mL of dexamethasone lidocaine  solution, and radiofrequency lesioning 7 degrees Celsius for 70 seconds.  The patient tolerated the procedure well.  Post injection instructions  given.  Return in 1 month for left side.      Erick Colace, M.D.  Electronically Signed     AEK/MEDQ  D:  07/06/2008 15:24:46  T:  07/07/2008 03:41:25  Job:  [id number]

## 2011-03-04 NOTE — Procedures (Signed)
Shirley Hamilton, Shirley Hamilton                ACCOUNT NO.:  [id number]   MEDICAL RECORD NO.:  [id number]          PATIENT TYPE:  REC   LOCATION:  [facility]                          FACILITY:  [facility]   PHYSICIAN:  Erick Colace, M.D.DATE OF BIRTH:  1936/02/08   DATE OF PROCEDURE:  07/26/2007  DATE OF DISCHARGE:                               OPERATIVE REPORT   This is a T12, L1, L2 medial branch and radiofrequency neurotomy under  fluoroscopic guidance.   INDICATIONS:  Lumbar facet-mediated pain, status post L4-5, L5-S1  fusion, with facet degeneration above the level of the fusion.  She has  had increased activity level and functioning after medial branch blocks  and prior radiofrequency neurotomy.  The right side was done last month  and she is now here for the left side.  She has noted some improvement  in her pain and activity level following the right-sided RF.   Informed consent was obtained after describing the risks and benefits of  the to the patient.  These include bleeding, bruising, infection, loss  of bowel and bladder function, temporary or permanent paralysis.  She  elects to proceed and has given written consent.   The patient placed prone on fluoroscopy table.  Betadine prep, sterile  drape.  A 25-gauge inch and a half needle was used to anesthetize skin  and subcu tissue with 1% lidocaine x2 mL.  Then a 20-gauge, 10-cm RF  needle with a 10-mm curved active tip inserted under fluoroscopic  guidance, first starting in the left L3 SAP-transverse process junction,  bone contact made, confirmed with lateral imaging.  Motor Stim x3 V  demonstrated no lower extremity twitch, followed by injection of 1 mL of  a solution containing 1 mL of 4 mg/mL dexamethasone and 2 mL of 1% MPF  lidocaine, RF lesioning 70 degrees x70 seconds.  Then the left L2 SAP-  transverse process junction targeted, bone contact made, confirmed with  lateral imaging.  Motor Stim x3 volts demonstrated no lower  extremity  twitch, followed by injection with 1 mL of the dexamethasone-lidocaine  solution, RF lesioning 70 degrees x70 seconds.  Then the left L1 SAP-  transverse junction targeted, bone contact made, confirmed with lateral  imaging.  Motor Stim x3 V demonstrated no lower extremity twitch,  followed by RF lesioning at 70 degrees x70 seconds.  The patient  tolerated the procedure well.  Postprocedure instructions given.      Erick Colace, M.D.  Electronically Signed     AEK/MEDQ  D:  07/26/2007 17:03:57  T:  07/27/2007 04:54:09  Job:  [id number]

## 2011-03-04 NOTE — Procedures (Signed)
[name], Shirley Hamilton                ACCOUNT NO.:  [id number]   MEDICAL RECORD NO.:  [id number]          PATIENT TYPE:  REC   LOCATION:  [facility]                          FACILITY:  [facility]   PHYSICIAN:  Erick Colace, M.D.DATE OF BIRTH:  02-25-1936   DATE OF PROCEDURE:  12/24/2007  DATE OF DISCHARGE:                               OPERATIVE REPORT   PROCEDURE:  Trigger point injection, right gluteus medius muscle area.   ATTENDING:  Erick Colace, M.D.   INDICATION:  Myofascial pain, right gluteus medius, only partially  responsive to medication management including narcotic analgesic  medications and diazepam.   Informed consent was obtained after describing risks and benefits of the  procedure including bleeding, bruising and infection; she elects to  proceed.   DESCRIPTION OF PROCEDURE:  The patient was placed prone on exam table  with Betadine prep and alcohol wipe.  A 25-gauge inch-and-a-half needle  was used to anesthetize the muscle followed by trigger point  deactivation.  The patient tolerated the procedure well.  Post-injection  instructions were given.      Erick Colace, M.D.  Electronically Signed     AEK/MEDQ  D:  12/24/2007 17:06:52  T:  12/27/2007 05:19:12  Job:  [id number]

## 2011-03-04 NOTE — Assessment & Plan Note (Signed)
Ms. Muscatello returns today. We did bilateral SI injection on October 28, 2007. She did well with that. I saw her last on November 26, 2007 and she  was doing well. She has had some recurrence of pain in the right buttock  area. It is further out towards the hip, as well as the PSIS area. Her  pain is around a 7 out of 10 which is stable compared to her last visit,  but it is sharp at times in the right buttock. It interferes with  activity at a moderate level and enjoyment of life only at a mild level.  She continues to go to church everyday with her husband. She does  housework and her other ADLs independently. Her pain is worse during  daytime hours. She sleeps well. There is no pain at night. She has pain  with walking and standing primarily. Her blood pressure is 132/71, pulse  80, respirations 18, O2 saturation is 98% on room air.   CURRENT MEDICATIONS:  She was switched from oxycodone to Percodan t.i.d.  due to a shortage of oxycodone. She is also on diazepam 5 mg b.i.d. She  has had no aberrant drug behaviors.   PHYSICAL EXAMINATION:  GENERAL:  No acute distress. Mood and affect are  appropriate.  BACK:  Tenderness to palpation in the lumbosacral paraspinals, but more  so in the right gluteus medius muscle. There is some tenderness over the  PSIS. She has negative Faber's. She has good spinal range of motion,  lumbar flexion, and extension.   Motor strength is 5/5 in the bilateral upper and lower extremities. Deep  tendon reflexes are normal.   IMPRESSION:  1. Lumbar facet syndrome improved after radiofrequency neurotomy.  2. Sacral ileac disorder status post effusion L4-5.   PLAN:  1. Trigger point injection today.  2. If that fails, would repeat right sacral ileac injection when we      see her back in one month.      Erick Colace, M.D.  Electronically Signed     AEK/MedQ  D:  12/24/2007 17:10:07  T:  12/25/2007 00:22:06  Job #:  [id number]   cc:   Casimiro Needle L.  Thad Ranger, M.D.  Fax: [phone]

## 2011-03-04 NOTE — Assessment & Plan Note (Signed)
Ms. Shirley Hamilton was seen by me approximately 1 month ago.  She was doing  quite well at that time, had a improvement in right hip pain related to  right hip trochanteric bursa injection performed on September 05, 2008.  Her radiofrequency procedure of the T12, L1, and L2 medial branches  appears to be still effective and that was performed in October 2009.   She has had no new medical problems.  She has had a migraine and had  tried Imitrex on this.  She does follow with Dr. Thad Ranger on this.   Her pain does interfere at a moderate level with her enjoying life as  well as relationship with other people and to a severe level with  general activity and she is really referring more now to her headache  pain.  Relief from meds is fair.  She walks 30 minutes and climb steps,  and drives.  She has some spasms in her neck area.   PHYSICAL EXAMINATION:  VITAL SIGNS:  His blood pressure 137/72, pulse  73, respirations 18, O2 sat 96% on room air.  GENERAL:  No acute stress.  Orientation x3.  Affect is flat.  Gait is  normal.  She has good neck range of motion.  NEUROLOGIC:  Cranial nerves II through XII are intact.  BACK:  No tenderness to palpation on lumbar paraspinals.  Good lumbar  spine range of motion.  Normal lower extremity strength and deep tendon  reflexes.  Oswestry disability index score of 46%.   IMPRESSION:  1. Lumbar post laminectomy syndrome with chronic postoperative pain.      We will continue her oxycodone 5 mg t.i.d.  2. Gave her Toradol injection 30 mg IM today to see if this can break      up her migraine symptomatology.  She has been instructed to      followup with Dr. Thad Ranger on this as well.  3. I will see her back in 2 months.  Nursing visit in 1 month.      Erick Colace, M.D.  Electronically Signed     AEK/MedQ  D:  10/31/2008 17:09:41  T:  11/01/2008 62:70:35  Job #:  [id number]   cc:   Hal T. Stoneking, M.D.  Fax: [phone]   Marolyn Hammock. Thad Ranger, M.D.  Fax: [phone]

## 2011-03-04 NOTE — Assessment & Plan Note (Signed)
Shirley Hamilton returns today.  A 75 year old female with lumbar facet  syndrome.  She has had improvements after her repeat T12, L1, L2 medial  branch blocks and radiofrequency neurotomies under fluoroscopic  guidance.  She has kind of taken it easy not going to work out  afterwards but she is going to resume her [employer] exercises.  Her  average pain is still rated as 8/10 although she is more functional and  taking less medications.   Her med regimen includes oxycodone 5 mg t.i.d.  She takes this both for  back pain, as well as for headache pain.  Diazepam 5 mg p.o. b.i.d.  She  sometimes takes Ambien CR at night.   Mood and affect are appropriate.  Her spinal ability is good.  She has  excellent forward flexion.  Her extension is limited to 75% but has  relatively little pain at end range.   Her blood pressure is 135/75.  Pulse 82.  Respiratory rate 18.  O2 sat  97% on room air.   Her gait is without evidence of toe drag or knee stability.  Her spine  has no tenderness to palpation along the lumbar paraspinal muscles.  Her  lower extremity strength is good and deep tendon reflexes are normal, as  is sensation.   IMPRESSION:  Lumbar post laminectomy syndrome with facet mediated pain  at L1-2, L2-3 levels.  She has L4-5 fusion.   PLAN:  Continue current medications.  I will see her back in 2 months.      Erick Colace, M.D.  Electronically Signed     AEK/MedQ  D:  08/24/2007 14:53:08  T:  08/25/2007 09:53:56  Job #:  [id number]   cc:   Casimiro Needle L. Thad Ranger, M.D.  Fax: [phone]

## 2011-03-04 NOTE — Assessment & Plan Note (Signed)
Shirley Hamilton returns today.  She was last seen by me on August 07, 2008.  She underwent left T12, L1-L2 medial branch radiofrequency neurotomy  under fluoroscopic guidance and has had good result in terms of that.  She does have residual right hip pain, however, and has pain that limits  her sleep.  Her last Oswestry disability questionnaire score was 33% on  August 07, 2008, and today it is 36%, which is no significant change,  however.  She has been able to drive to [place] since having a  radiofrequency neurotomy.   MEDICATIONS:  Oxycodone 5 mg q.i.d., this was temporarily bumped up due  to her radiofrequency, but states that she has had some flare-up of her  pain because she is started to get a little bit more active and also she  has had increasing hip pain.  In the past, she has had Orthopedics  inject of her right hip with corticosteroid and this has proved  effective for her.   Pain level is 8/10.  She can walk 30 minutes at a time.  She can climb  steps.  She is retired.  She has had some constipation.   SOCIAL HISTORY:  Married, lives with her husband, will have 1 or 2  drinks 3 times a week.   PHYSICAL EXAMINATION:  VITAL SIGNS:  Blood pressure 153/84, pulse 76,  respirations 18, and O2 sat 96% on room air.  GENERAL:  No acute distress.  Orientation x3.  Gait is normal.  EXTREMITIES:  Without edema.  She has tenderness to palpation over the  right greater trochanter.   IMPRESSION:  1. Lumbar spondylosis without myelopathy, improved after      radiofrequency neurotomy.  2. Right hip pain, probable trochanter bursitis.  Pain is only      partially responsive to medication management.  She has had      stretching program in the past as well.  Pain does interfere with      sleep.  We will inject today.  3. I will see her back in about 1 month.  We will hope to wean her      back down on her oxycodone to t.i.d. dosing.      Erick Colace, M.D.  Electronically Signed     AEK/MedQ  D:  09/05/2008 13:47:54  T:  09/06/2008 04:31:16  Job #:  [id number]   cc:   Hal T. Stoneking, M.D.  Fax: [phone]

## 2011-03-04 NOTE — Procedures (Signed)
Shirley Hamilton, Shirley Hamilton                ACCOUNT NO.:  [id number]   MEDICAL RECORD NO.:  [id number]          PATIENT TYPE:  REC   LOCATION:  TPC                          FACILITY:  [facility]   PHYSICIAN:  Erick Colace, M.D.DATE OF BIRTH:  01-14-1936   DATE OF PROCEDURE:  DATE OF DISCHARGE:                               OPERATIVE REPORT   PROCEDURE:  Left T12, L1-L2 medial branch radiofrequency neurotomy under  fluoroscopic guidance.   INDICATIONS:  Lumbar spondylosis without myelopathy, 10 months  improvement after last radiofrequency procedure at corresponding levels.  Pain is only partially responsive to medication management including  narcotic analgesic medications and interferes with standing, walking,  and lifting.   Informed consent was obtained after describing the risks and benefits of  the procedure with the patient.  These include bleeding, bruising,  infection, temporary or permanent paralysis.  She elects to proceed and  has given written consent.  The patient was placed prone on fluoroscopy  table.  Betadine prep and sterile draped.  A 25-gauge, 1-1/2-inch needle  was used to anesthetize the skin and the subcu tissue with 1% lidocaine  x2 mL at each site and then a 20-gauge 10-cm RF needle with 10-mm curved  active tip was inserted under fluoroscopic guidance targeting the left  L3 SAP transverse process junction.  Bone contact made confirmed with  lateral imaging.  Sensory stem at 50 Hz followed by motor stem  stimulation at 2 Hz confirmed proper needle location followed by  injection of the solution containing of 1 mL of 4 mg/mL dexamethasone  and 2 mL of 2% lidocaine followed by radiofrequency lesioning at 70-  degree Celsius for 70 seconds.  Then, the right L2 SAP transverse  process junction targeted bone contact made confirmed with lateral  imaging, sensory stimulation at 50 Hz followed by motor stimulation at 2  Hz confirmed proper needle location followed by  injection of 1 mL of the  dexamethasone-lidocaine solution and radiofrequency lesioning at 70-  degree Celsius for 70 seconds.  Then, the left L1 SAP transverse process  junction targeted, bone contact made, confirmed with lateral imaging,  sensory stem dilation and 50 Hz followed by motor stimulation at 2 Hz  confirmed proper needle location, followed by injection of 1 mL of the  dexamethasone-lidocaine solution and radiofrequency lesioning at 70-  degree Celsius for 70 seconds.  The patient tolerated the procedure  well.  She had one complaint of substernal chest pain which was mild to  moderate, but this was very brief.  Pre- and post-injection and vitals  were stable.  She was instructed that if this recurred, to seek further  medical attention per primary care physician, Dr. Merlene Laughter.      Erick Colace, M.D.  Electronically Signed     AEK/MEDQ  D:  08/07/2008 13:34:48  T:  08/08/2008 02:32:46  Job:  [id number]   cc:   Hal T. Stoneking, M.D.  Fax: [phone]

## 2011-03-04 NOTE — Assessment & Plan Note (Signed)
Shirley Hamilton returns today.  I last saw her Mar 11, 2007.  At that time  she had been doing quite well in terms of her back pain.  She feels that  over the last month or so her pain has gotten increased.  She has  started at the [facility] a community type exercise program, and has a  Systems analyst.  She has completed physical therapy since I last saw  her, and was quite satisfied with the treatment she received.   CURRENT MEDICATIONS:  1. Oxycodone 5 p.o. t.i.d., last filled May 25, 2007.  2. Flexeril 5 mg t.i.d.  3. Diazepam 5 mg b.i.d.   NON-PAIN MEDICINES:  1. Lipitor.  2. Premarin.  3. Dramamine.  4. Ambien.  5. Imitrex.   PHYSICAL EXAMINATION:  GENERAL:  In no acute distress.  Mood and affect  appropriate.  Her back has some tenderness to palpation above the level of her fusion.  In addition, she has pain increased with extension greater than with  flexion.  She is a bit sore in the hip area, just in the buttock area  just from working out yesterday.  Her lower extremity strength is  normal.  Deep tendon reflexes normal.  Gait is normal.   IMPRESSION:  Lumbar facet syndrome, above the level of her previous L4-5  fusion.  She has had good results from a right radiofrequency neurotomy  in the T12, L1, L2 medial branches, performed in June of 2007, and the  same procedure done on the left side in July.  This is the expected  duration of effect, and as explained to the patient, there is an 85%  chance that repeat procedure will have similar good results.   Instead of escalating her narcotic analgesic medications, we will get  her in for another procedure, but bridge her with a 10 day supple of  Limbrel 250 b.i.d. as a mild anti-inflammatory, and Skelaxin 800 t.i.d.  p.r.n.      Erick Colace, M.D.  Electronically Signed     AEK/MedQ  D:  06/03/2007 15:08:00  T:  06/04/2007 10:42:01  Job #:  [id number]   cc:   Casimiro Needle L. Thad Ranger, M.D.  Fax: [phone]

## 2011-03-04 NOTE — Op Note (Signed)
Shirley Hamilton, TOPOR                ACCOUNT NO.:  [id number]   MEDICAL RECORD NO.:  [id number]          PATIENT TYPE:  AMB   LOCATION:  [facility]SC                          FACILITY:  [facility]   PHYSICIAN:  Leonides Grills, M.D.     DATE OF BIRTH:  [birth year]   DATE OF PROCEDURE:  05/23/2008  DATE OF DISCHARGE:                               OPERATIVE REPORT   PREOPERATIVE DIAGNOSES:  1. Right hallux valgus.  2. Right second through fifth hammertoes.   POSTOPERATIVE DIAGNOSES:  1. Right hallux valgus.  2. Right second through fifth hammertoes.   OPERATION:  1. Right modified McBride bunionectomy.  2. Right great toe digital nerve neurolysis.  3. Right third through fifth toes metatarsophalangeal joint dorsal      capsulotomies with collateral release.  4. Right second through fifth toes proximal phalanx head resection.  5. Right second and fourth toes, extensor digitorum brevis to extensor      digitorum longus tendon transfers.  6. Right fifth toe extensor digitorum longus Z-lengthening.   ANESTHESIA:  General.   SURGEON:  Leonides Grills, MD   ASSISTANT:  Richardean Canal, PA-C   ESTIMATED BLOOD LOSS:  Minimal.   TOURNIQUET TIME:  Approximately an hour and 20 minutes.   COMPLICATIONS:  None.   DISPOSITION:  Stable to PR.   INDICATIONS:  This is a 75 year old female who has had right forefoot  pain that was interfering with her life where she could not do what she  wanted to do secondary to the above pathology.  She was consented of the  above procedure.  All risks which infection, neuro vessel injury,  persistent pain, worsening pain, prolonged recovery, stiffness,  recurrence of the deformity, hallux varus deformity were all explained,  questions were encouraged, and answered.   OPERATION:  The patient was brought to the operating room and placed in  supine position after adequate general endotracheal anesthesia was  administered as well as Ancef 1 gram IV piggyback.  The right  lower  extremity was then prepped and draped in a sterile manner over a  proximally placed thigh tourniquet.  The limb was gravity exsanguinated  and tourniquet elevated to 290 mmHg.  A longitudinal incision midline  over the medial aspect of the right great toe MTP joint was then made.  Dissection was carried down through skin.  Hemostasis was obtained.  L-  shaped capsulotomy was then made.  Simple bunionectomy was performed  with sagittal saw.  Lateral capsule was then released with curved Beaver  blade.  This had excellent release of the tight capsule.  Rocky Link Johnson's  ridge was then rounded off the rongeur.  The area and joint were  copiously irrigated with normal saline.  Capsule was then repaired and  advanced both superiorly and proximally with 2-0 Vicryl stitches.  This  had an Conservation officer, historic buildings.  Sesamoids were located.  Range of motion toe  was excellent.  We then made a longitudinal incision over the right  third, fourth, and fifth toes respectively.  Dorsal dissection was  carried down through skin.  Hemostasis was obtained.  The EDL and EDB  were identified.  The EDL tendon was tenotomized proximal, medial, and  then brevis distal lateral retracted out of harm's way for later  transfer.  In the MTP joint dorsal capsulotomy with collateral release  was then performed with 15 blade scalpel protecting the neurovascular  structures, both medially and laterally.  Distal aspect of proximal  phalanx was then skeletonized and then the head was then removed with a  rongeur.  This head resection was made perpendicular to long axis of the  proximal phalanx.  We then did the same exact procedure for the fourth  and fifth toes respectively except for the fifth toe.  It was purely EDL  tendon that was Z lengthened and later repaired.  We then placed a 0.045  K-wire antegrade through middle and distal phalanx, reduced the PIP  joint, and fired this retrograde across the MTP joint with the  toe held  in reduced position.  Again, this was done for the third, fourth, and  fifth toes respectively.  We then transferred the EDB to EDL tendon  using 3-0 PDS stitch and again this had an outstanding repair.  We also  repaired the EDL Z-lengthening with 3-0 PDS stitch as well and again  this had an outstanding repair.  Tourniquet was deflated.  Hemostasis  was obtained.  Toes pinked up nicely.  Skin relieving incisions were  made on either side of the K-wire.  The K-wires were bent, cut, and  capped.  All wounds and the area were copiously irrigated with normal  saline.  Skin was closed with 4-0 nylon stitch.  Sterile dressing was  applied.  Roger-Mann dressing was applied.  Hard sole shoe was applied.  The patient was stable to the PR.      Leonides Grills, M.D.  Electronically Signed     PB/MEDQ  D:  05/23/2008  T:  05/23/2008  Job:  [id number]

## 2011-03-04 NOTE — Procedures (Signed)
NAMEFADUMA, CHO                ACCOUNT NO.:  [id number]   MEDICAL RECORD NO.:  [id number]          PATIENT TYPE:  REC   LOCATION:  [facility]                          FACILITY:  [facility]   PHYSICIAN:  Erick Colace, M.D.DATE OF BIRTH:  05/07/1936   DATE OF PROCEDURE:  09/05/2008  DATE OF DISCHARGE:                               OPERATIVE REPORT   PROCEDURE:  Right trochanteric bursa injection.   INDICATION:  Right hip pain, which is only partially responsive to  medication management and other conservative care and has improved after  previous trochanteric bursa injection, last one having been done 3-4  months ago.   Pain does interfere with activity level as well as sleeping.   PROCEDURE IN DETAIL:  Informed consent was obtained after describing  risks and benefits of the procedure with the patient.  These include  bleeding, bruising, infection, temporary, or permanent increased pain.  She elects to proceed.  She was placed in a left lateral decubitus  position, area marked, prepped with Betadine and alcohol.  Entered with  a 21-gauge, 2-inch needle.  Solution containing 1 mL of 40 mg/cc Depo-  Medrol, 4 mL of 1% lidocaine were injected.  The patient tolerated  procedure well.  Post injection instructions given.  Band-Aid applied.  Follow up with me in 1 month.      Erick Colace, M.D.  Electronically Signed     AEK/MEDQ  D:  09/05/2008 13:30:00  T:  09/06/2008 01:37:48  Job:  [id number]

## 2011-03-04 NOTE — Assessment & Plan Note (Signed)
Shirley Hamilton returns today.  I last saw her on February 17, 2008.  She is  approximately 8-9 months post radiofrequency T12, L1, L2 medial  branches.  She has done relatively well on 5 mg of oxycodone t.i.d.  She  did have flareup of some hip pain, was seen by Dr. Thomasena Edis from  Orthopedics, had a trochanteric bursa injection followed by physical  therapy with tensor fascia lata stretching.  She had mild osteoarthritic  changes.  This was on the right side back in May 2009.   In addition, she has had been seen by Dr. Leonides Grills for a bunion,  first MTP.  She has finally had a surgery with Dr. Lestine Box.  She  averages her pain around 5/10 that goes up to 9 at times, interferes  with activity at a moderate levels.  Sleep is fair.  Pain improves with  rest and medications.  Relief from meds is good.  She could walk about a  half hour at a time.  She climbs steps, she drives.  She goes to church  everyday.   SOCIAL HISTORY:  Lives with her husband, married, has 2 ounces of  alcoholic beverage per day.   PHYSICAL EXAMINATION:  VITAL SIGNS:  Blood pressure 140/69, pulse 86,  respirations 18, and O2 sat 97% on room air.  GENERAL:  In no acute distress.  Orientation x3.  Affect is alert.  Gait  is normal.  She has no tenderness over the greater trochanter.  Good  range of motion in bilateral hips.  She has normal strength in the hip  flexion, knee extension, and ankle dorsiflexion.   Her back has some tenderness in the lumbar paraspinals.  She has no  directionality to her pain in terms of range of motion.  She has full  lumbar range of motion.  She does have some pain when she sits in the  lower back area, which appears to be at L5-S1 region probably below the  level of fusion at L4-5.   IMPRESSION:  1. Lumbar postlaminectomy syndrome.  2. Lumbar spondylosis without myelopathy.  3. Trochanteric bursitis, improved.   PLAN:  I will see her back in about 3 months.   Postoperatively, will have  Dr. Lestine Box manage her pain medicines.  I  have written a month's supply for oxycodone 5 mg t.i.d. #90 to be filled  on May 20, 2008.   No need for repeat radiofrequency neurotomy at this time.      Erick Colace, M.D.  Electronically Signed     AEK/MedQ  D:  05/12/2008 13:45:21  T:  05/13/2008 05:25:16  Job #:  [id number]   cc:   Leonides Grills, M.D.  Fax: [phone]   Hal T. Stoneking, M.D.  Fax: [phone]   Erasmo Leventhal, M.D.  Fax: [phone]

## 2011-03-07 NOTE — Procedures (Signed)
NAMESHELIE, Shirley Hamilton                ACCOUNT NO.:  [id number]   MEDICAL RECORD NO.:  [id number]          PATIENT TYPE:  REC   LOCATION:  [facility]                          FACILITY:  [facility]   PHYSICIAN:  Erick Colace, M.D.DATE OF BIRTH:  1936/09/06   DATE OF PROCEDURE:  02/02/2006  DATE OF DISCHARGE:                                 OPERATIVE REPORT   PROCEDURE PERFORMED:  Bilateral T12, L1, L2 medial branch blocks performed  under fluoroscopic guidance.   INDICATIONS FOR PROCEDURE:  L4-5 fusion with this same procedure on December 29, 2005 giving two-day complete relief of pain.   Informed consent was obtained after describing risks and benefits of the  procedure to the patient to include bleeding, bruising, infection, loss of  bowel and bladder function, temporary or permanent paralysis.  She elects to  proceed and was given written consent.   DESCRIPTION OF PROCEDURE:  The patient was placed prone on fluoroscopic  table with Betadine prep and sterile drape.  A 25 gauge 1-1/2 inch needle  used to anesthetize the skin and subcutaneous tissue with 1% lidocaine 2 mL  at each site.  Then a 22 gauge 3-1/2 inch spinal needle was inserted first  on the right side targeting the right L3 SAP transverse process junction.  Bone contact was made confirmed with lateral imaging. Omnipaque 180 0.3 mL  demonstrated no intravascular uptake and 0.5 mL of solution containing 1 mL  of Depo-Medrol 40 mg per mL plus 2 mL of 2%  lidocaine.  Next, the L2 SAP  transverse process junction was targeted.  Bone contact made, confirmed with  lateral imaging and 0.5 mL of Omnipaque 180 demonstrating no intravascular  uptake and 0.5 mL Depo-Medrol lidocaine solution injected.  Next, the right  L1 SAP transverse process junction targeted, bone contact made confirmed  with lateral imaging.  Omnipaque 180 x 0.3 mL demonstrated intravascular  uptake, therefore needle was repositioned  and once again Omnipaque 180  under life fluoroscopy demonstrated no intravascular uptake and 0.5 mL of  Depo-Medrol lidocaine solution injected.  Next the left L1 SAP transverse  process junction targeted.  Bone contact made, confirmed with lateral  imaging.  Omnipaque 180 x 0.3 mL demonstrated no intravascular uptake and  0.5 mL of Depo-Medrol and lidocaine solution injected.  Next the left L2 SAP  transverse process junction targeted and bone contact made and confirmed  with lateral imaging.  Omnipaque 180 x 0.3 mL demonstrated no intravascular  uptake.  Then 0.5 mL of Depo-Medrol and lidocaine solution were injected.  Next, the left L3 SAP transverse process junction was targeted.  Bone  contact made, confirmed with lateral imaging.  Omnipaque 180 x 0.5 mL  demonstrated no intravascular uptake and 0.5 mL Depo-Medrol lidocaine  solution was injected.  The patient tolerated the procedure well.  Post  injection instructions given. A work sheet for pain was provided, return in  one month and if she has similar effects compared to last visit, would do  medial branch radiofrequency starting with right side.   Also prescribed oxycodone 5 mg three times  daily as needed for both  headaches and back pain.  Instructed her that she can if she has  breakthrough pain can use Aleve 3 tablets.      Erick Colace, M.D.  Electronically Signed     AEK/MEDQ  D:  02/02/2006 10:21:46  T:  02/02/2006 14:28:33  Job:  [id number]   cc:   Hilda Lias, M.D.  Fax: [phone]

## 2011-03-07 NOTE — Op Note (Signed)
NAMEHAN, LYSNE                          ACCOUNT NO.:  [id number]   MEDICAL RECORD NO.:  [id number]                   PATIENT TYPE:  INP   LOCATION:  3015                                 FACILITY:  [facility]   PHYSICIAN:  Hilda Lias, M.D.                DATE OF BIRTH:  12/14/1935   DATE OF PROCEDURE:  01/17/2003  DATE OF DISCHARGE:                                 OPERATIVE REPORT   PREOPERATIVE DIAGNOSIS:  Left L4-5 herniated disk, foraminal, with L4 and L5  radiculopathy.   POSTOPERATIVE DIAGNOSIS:  Left L4-5 herniated disk, foraminal, with L4 and  L5 radiculopathy.   PROCEDURE:  Left L4-5 diskectomy, decompression of the L4 and L5 nerve root.  Microscope.  C-arm.  [facility].   SURGEON:  Hilda Lias, M.D.   ASSISTANT:  Danae Orleans. Venetia Maxon, M.D.   CLINICAL HISTORY:  The patient is a 75 year old lady complaining of back and  left leg pain.  The patient was seen in the emergency room and later in the  office.  MRI showed that she has a herniated disk, foraminal, at the level  of 4-5.  Surgery was advised.   DESCRIPTION OF PROCEDURE:  The patient was taken to the OR and she was  positioned in a prone manner.  The back was prepped with Betadine.  With the  C-arm with the help of a pointer, we localized the area between 4 and 5 on  the left side.  Infiltration of the skin with Xylocaine was made.  Incision  of about less than an inch was made through the skin and fascia.  A dilator  was inserted all the way until the one of 50 mm x 6 cm fit.  We brought the  microscope into the area, and we were able to see the facet as well as the  lamina of L4 and L5.  With the drill we drilled part of the facet of the  upper of L5 and the lower of L4.  A thick yellow ligament was also excised.  We identified the L5 nerve root.  It was quite sensitive.  Foraminotomy was  accomplished, and decompression of the L5 was done.  Then dissection was  carried superiorly until we found the disk.   Indeed there was some  herniation, no central but mostly going laterally.  An incision was made and  using the straight plus up-bite pituitary rongeur, total gross diskectomy  laterally and medial was accomplished.  At the end with the nerve hook, we  were able to explore the L4 nerve root, which was absolutely quite open  without any compromise.  Because of that we decided not to ____ the  foramina.  At the end the area was irrigated, Valsalva maneuver was  negative.  Fentanyl and Depo-Medrol were left in the epidural space, and the  wound was closed with Vicryl and Steri-Strip.  Hilda Lias, M.D.    EB/MEDQ  D:  01/17/2003  T:  01/18/2003  Job:  [id number]

## 2011-03-07 NOTE — Procedures (Signed)
NAMESANDRA, Shirley Hamilton                ACCOUNT NO.:  [id number]   MEDICAL RECORD NO.:  [id number]          PATIENT TYPE:  REC   LOCATION:  T[facility]                          FACILITY:  [facility]   PHYSICIAN:  Erick Colace, M.D.DATE OF BIRTH:  02-27-1936   DATE OF PROCEDURE:  03/20/2006  DATE OF DISCHARGE:                                 OPERATIVE REPORT   PROCEDURE:  Right-sided T12, L1, L2 medial branch blocks under fluoroscopic  guidance and right-sided T12, L2, L1 radiofrequency neurotomy.   INDICATION:  Lumbar pain with 2 week relief of pain with medial branch  blocks performed x3.   The patient placed prone on fluoroscopy table, Betadine prep, sterile drape,  25 gauge 1.5 inch needle was used to inject skin and subcu tissues with 1%  lidocaine x 1.5 mL and a 10 mm active tip 10 cm curved RF needle was  utilized, first trying the right L3 SAP transverse process junction, bone  contact made confirmed with lateral imaging.  Motor estim 3 V showed only  local twitch, and then a solution containing 1 mL of 40 mg/mL Kenalog plus 2  mL of 1% MPF lidocaine were injected x0.75 mL.  Then lesioning was performed  70 degrees x70 seconds.  Next, the L2 SAP transverse process junction  contacted was targeted, bone contact made confirmed with lateral imaging,  motor stem 3 V demonstrated no lower extremity twitch and 0.75 mL of the  Depo-Medrol lidocaine solution injected followed by RF lesioning 70 degrees  x 70 seconds and last the right L1 SAP transverse process junction targeted,  bone contact made confirmed with imaging, motor stem 3 V demonstrated no  lower extremity twitch, and then 0.75 mL of the Depo-Medrol lidocaine  solution was injected followed by radiofrequency lesioning 70 degrees x70  seconds.  The patient tolerated the procedure well postinjection.  Postprocedure instructions given.      Erick Colace, M.D.  Electronically Signed     AEK/MEDQ  D:  03/20/2006 09:39:50   T:  03/20/2006 13:34:49  Job:  [id number]

## 2011-03-07 NOTE — Procedures (Signed)
NAMEBRYNLYNN, WALKO                ACCOUNT NO.:  [id number]   MEDICAL RECORD NO.:  [id number]          PATIENT TYPE:  REC   LOCATION:  TPC                          FACILITY:  [facility]   PHYSICIAN:  Erick Colace, M.D.DATE OF BIRTH:  [birth year]   DATE OF PROCEDURE:  DATE OF DISCHARGE:                                 OPERATIVE REPORT   MEDICAL RECORD NUMBER:  [id number]   DATE OF BIRTH:  Jul 07, 1936   PROCEDURE:  Bilateral T12, L1, L2 medial branch blocks under fluoroscopic  guidance.   INDICATION:  The patient has an L4-5 fusion.  I suspicion that she has facet  arthropathy above the level of fusion, i.e., L2-3 and L3-4 levels.  She has  instrumentation limiting access to L4 plus probable bone fusion material in  that area.  She has pain only partially responsive to narcotic analgesics.   INFORMED CONSENT:  Informed consent was obtained after describing the risks  and benefits of the procedure to the patient; these including bleeding,  bruising, infection, loss of bowel and bladder function, temporary or  permanent paralysis.  She elects to proceed and has given written consent.   DESCRIPTION OF PROCEDURE:  The patient was placed prone on the fluoroscopy  table with Betadine prep and sterile drape.  A 25-gauge 1-1/2 inch needle  was used to anesthetize skin and subcu tissues with 1% lidocaine, 2 mL at  each side.  Then a 22-gauge 3-1/2-inch spinal needle was inserted first on  the left side, targeting left L3 SAP-transverse process junction, bone  contact made, confirmed with lateral imaging and Omnipaque 180 x 0.5 mL  demonstrated no intravascular uptake and 0.5 mL of a solution containing 1  mL of Depo-Medrol 40 mg/mL plus 2 mL of 2% methylparaben-free lidocaine.  Next, the L2 SAP-transverse process junction was targeted and the area of  bone contact made, confirmed with lateral imaging, then 0.5 mL of Omnipaque  180 demonstrated no intravascular uptake, then 0.5 mL of  the Depo-  Medrol/lidocaine solution was injected.  Next, the L1 SAP-transverse process  junction was targeted and bone contact made, confirmed with lateral imaging  and 0.5 mL of Omnipaque 180 demonstrated no intravascular uptake and 0.5 mL  of Depo-Medrol/lidocaine solution was injected.  Then the C-arm was obliqued  toward the right and the right L1 SAP-transverse process junction targeted  and bone contact made, confirmed with lateral imaging and 0.5 mL of Depo-  Medrol/lidocaine solution injected, then the L2 SAP-transverse process  junction targeted and bone contact made, confirmed with lateral imaging,  then 0.5 mL of Omnipaque 180 demonstrated no intravascular uptake and 0.5 mL  of the Depo-Medrol/lidocaine solution was injected and last, the right L3  SAP-transverse process junction was targeted and bone contact made,  confirmed with lateral imaging and 0.5 mL of Omnipaque 180 injected, then  0.5 mL of Depo-Medrol/lidocaine solution injected.  The patient tolerated  the procedure well.   Post-injection instructions were given.   Preinjection pain level 6-8/10.  Post-injection pain level 4/10.   She will return in 3 or 4 weeks  for repeat injection.  Should she have a  sustained several-hour to several-day pain relief of at least 50%, I will  reinject same levels; if not, I would go down to the L5-S1 level and do  intra-articular facet injections.      Erick Colace, M.D.  Electronically Signed     AEK/MEDQ  D:  12/29/2005 10:53:38  T:  12/30/2005 12:06:33  Job:  [id number]

## 2011-03-07 NOTE — Assessment & Plan Note (Signed)
REASON FOR VISIT:  This is a follow-up office visit for low back pain.   HISTORY OF PRESENT ILLNESS:  The patient underwent left T12/L1-L2 medial  branch block and radiofrequency procedure on May 28, 2006.  She previously  had right-sided T12/L1-L2 medial branch block and radiofrequency procedure  performed on April 20, 2006.  She has done quite well post procedure.  Pain  levels are down to about 5 out of 10 and compares with 8 out of 10 pre-  injection.  Her pain is worse in the evening.  She is climbing steps,  driving, and walking 44-03 minutes at a time.  She is assisting with her  husband who has had recent bilateral total knee replacement.   PHYSICAL EXAMINATION:  VITAL SIGNS:  Blood pressure 116/63, pulse 98,  respirations 18, and O2 saturation 98% on room air.  GENERAL:  No acute distress, affect appropriate.  BACK:  Her back has no tenderness to palpation in the lumbar paraspinal.  She has full range, forward flexion, and extension surprisingly even with  her fused segments at the L4-5 level.  NEUROLOGIC:  Her gait is without evidence of toe drag or knee instability.   IMPRESSION:  Lumbar facet arthropathy, levels above fusion.  She has done  quite well with lumbar radiofrequency procedure and would like for her to  restart physical therapy.  She states that right now due to her husband's  surgery, she will have to hold off but wants to start up therapy as soon as  it is feasible.  I will her back in about two months.  We will continue her  current medications which include oxycodone 5 mg three times a day, diazepam  5 mg twice a day p.r.n., and Flexeril 5 mg three times a day.  She will pick  up her prescriptions next month and I will her the month after.      Erick Colace, M.D.  Electronically Signed     AEK/MedQ  D:  07/06/2006 14:40:02  T:  07/07/2006 11:54:57  Job #:  [id number]   cc:   Erasmo Leventhal, M.D.  Fax: [phone]   Hilda Lias, M.D.  Fax: [phone]   Hal T. Stoneking, M.D.  Fax: [phone]

## 2011-03-07 NOTE — Op Note (Signed)
   NAME:  Shirley Hamilton, BROSKI                          ACCOUNT NO.:  [id number]   MEDICAL RECORD NO.:  [id number]                   PATIENT TYPE:  INP   LOCATION:  3028                                 FACILITY:  [facility]   PHYSICIAN:  Hilda Lias, M.D.                DATE OF BIRTH:  Jun 24, 1936   DATE OF PROCEDURE:  DATE OF DISCHARGE:                                 OPERATIVE REPORT   No dictation for this job.                                               Hilda Lias, M.D.    EB/MEDQ  D:  06/23/2003  T:  06/24/2003  Job:  [id number]

## 2011-03-07 NOTE — Op Note (Signed)
[place]  Patient:    Shirley Hamilton, Shirley Hamilton                       MRN: [id number] Proc. Date: 07/20/00 Adm. Date:  [id number] Attending:  Charlton Haws                           Operative Report  ACCOUNT:  [id number]  PREOPERATIVE DIAGNOSES: 1. Skin tag or papilloma of right ear. 2. Anal condyloma. 3. Hemorrhoids.  POSTOPERATIVE DIAGNOSES: 1. Skin tag or papilloma of right ear. 2. Anal condyloma. 3. Hemorrhoids.  OPERATION: 1. Excision of skin tag of right ear lobe. 2. Proctoscopy to 25.0 cm. 3. Laser anal condyloma. 4. Excision of skin tags of anus. 5. Partial hemorrhoidectomy, plus rubber banding of hemorrhoids.  SURGEON:  Currie Paris, M.D.  ANESTHESIA:  General endotracheal.  INDICATIONS:  This patient is a 75 year old who has had some hemorrhoidal disease with some external and internal disease noted.  She also had some irregularities of the perianal area, plus a few anal condyloma, which had somewhat responded to outpatient with podophyllin, but it was decided at this point to do a more complex excision, or treatment with laser, plus get a better internal examination to see if there is any internal disease with the condyloma, and treat the hemorrhoids as deemed appropriate at the time of surgery.  In addition, the patient pointed out a papilloma of the right ear lobe just inside the tragus, which she wanted to have removed.  It is about 3.0 mm in size.  DESCRIPTION OF PROCEDURE:  The patient is brought to the operating room, and after satisfactory general endotracheal anesthesia had been obtained, was placed in the prone position on the operating room table, with the appropriate padding.  The area of the ear was approached first, and prepped with alcohol sponges.  The small area was excised and the base cauterized with electrocautery.  The perianal area was taped apart and a proctoscopy done with the rigid  scope to 25.0 cm, with normal findings except for perianal disease which will be described in more detail below.  Next, the anoscope was introduced.  On the right side posteriorly were a couple of tiny residual anal condylomas.  There were a couple of what appeared to be some old skin tags or scar tissue from prior treatment, but did not appear to be active disease.  Rectal examination had been unremarkable.  The internal examination with the anoscope showed some mild internal disease, most particularly left lateral, but some right anterior and posterior disease. There was a prominent external component to the left lateral, and minimal to the right anterior.  The area was then padded with moist sponges, and areas of condylomas treated with laser on 10 watts.  The area around them was then treated with the laser defocused, to try to sterilize the vicinity.  The anoscope was reintroduced.  At the time of the internal anoscopy I had injected approximately 30 cc of 0.25% Marcaine with epinephrine into the perianal tissues.  I decided to excise the left lateral hemorrhoid, since it had both significant internal and external disease.  A crown suture was placed and then tied using #2-0 chromic.  Then a V-shaped incision was made in the anoderm and the hemorrhoid elevated with scissors and cautery off of the sphincter.  I apparently got below the sphincter.  I then placed  a hemostat on the residual hemorrhoid and excised it.  The chromic was used in a running suture to close the mucosa, and then the external was closed with some #4-0 Vicryl.  Attention was turned to the right-sided hemorrhoids, and both right anterior and posterior internal portions were banded.  What appeared to be a chronic hemorrhoidal external tag was excised in the right anterior position and closed with a #4-0 Vicryl.  External disease in the right posterior did not appear significant, and was not surgically approached  at this time.  Everything appeared to be dry.  I used some Gelfoam for packing and then sterile dressings.  The patient tolerated the procedure well.  There were no intraoperative complications.  All counts were correct. DD:  07/20/00 TD:  07/20/00 Job: [id number]

## 2011-03-07 NOTE — Procedures (Signed)
Shirley Hamilton, Shirley Hamilton                ACCOUNT NO.:  [id number]   MEDICAL RECORD NO.:  [id number]          PATIENT TYPE:  REC   LOCATION:  TPC                          FACILITY:  [facility]   PHYSICIAN:  Erick Colace, M.D.DATE OF BIRTH:  March 21, 1936   DATE OF PROCEDURE:  DATE OF DISCHARGE:                                 OPERATIVE REPORT   PROCEDURE:  Bilateral medial branch blocks, T12, L2, L1, under fluoroscopic  guidance.   INDICATIONS FOR PROCEDURE:  Lumbar pain, status post fusion, L4-5.  Had a  two week relief of pain with previous procedure.  Only got re-aggravated  with lifting a 44-year-old.   Informed consent was obtained after describing the risks and benefits of the  procedure with the patient.  These include bleeding, bruising, infection,  temporary or permanent paralysis.  She elects to proceed.  Initially  scheduled as radiofrequency but radiofrequency generator failed to produce  adequate heat.   The patient was placed prone on the fluoroscopy table.  Betadine prep.  Sterile drape.  A 25 gauge 1-1/2 inch needle was used to incise the skin and  subcutaneous tissues.  Initially, an R-F needle was used, 10 mm active tip  curved with 10 cm 22 gauge needle inserted.  Bone contact was then  confirmed.  Lateral imaging, motor stem demonstrated only local twitch, then  the solution containing 1 cc of 40/cc Kenalog plus 2 cc of 1% methylparaben-  free lidocaine injected x4-5 cc.  Lesioning was attempted; however, the  machine failed to produce adequate temperature for neurotomy.  Tried pulse  as well as continuous mode with no bump in temperature above 37 degrees.  Elected to proceed with medial branch blocks.   Next, the L2 SAP transverse process junction targeted.  Bone contact made,  confirmed with lateral imaging.  Omnipaque 180 x 0.5 cc demonstrated no  intravascular uptake and 25 cc of Depo-Medrol lidocaine solution was  injected.  Next, the L1 SAP transverse process  junction targeted.  Bone  contact made, confirmed with lateral imaging.  Omnipaque 180 x 0.5 cc  demonstrated no intravascular uptake and 0.5 cc of Depo-Medrol lidocaine  solution was injected.  Next, the C-arm was obliqued towards the left.  The  left L1 SAP transverse process junction targeted.  Bone contact made,  confirmed with lateral imaging.  Omnipaque 180 x 0.5 cc demonstrated no  intravascular uptake and 0.5 cc of Depo-Medrol/lidocaine solution injected.   Next, L2 SAP transverse process junction targeted.  Bone contact made,  confirmed with lateral imaging.  Omnipaque 180 x 0.5 cc demonstrated no  intravascular uptake with 0.5 cc of Depo-Medrol/lidocaine solution injected.  The left L3 SAP transverse process junction targeted.  Bone contact made and  confirmed with lateral imaging.  Omnipaque 180 x 0.5 cc demonstrated no  intravascular uptake and 0.5 cc of Depo-Medrol/lidocaine  solution was injected.  Patient tolerated the procedure well.  Post  injection instructions given.  She is to follow up with lumbar once  technical issues can be resolved.      Erick Colace, M.D.  Electronically  Signed     AEK/MEDQ  D:  03/02/2006 12:00:39  T:  03/02/2006 78:29:56  Job:  [id number]

## 2011-03-07 NOTE — Discharge Summary (Signed)
   NAMESARAHGRACE, BROMAN                          ACCOUNT NO.:  [id number]   MEDICAL RECORD NO.:  [id number]                   PATIENT TYPE:  INP   LOCATION:  3028                                 FACILITY:  [facility]   PHYSICIAN:  Hilda Lias, M.D.                DATE OF BIRTH:  Aug 17, 1936   DATE OF ADMISSION:  06/23/2003  DATE OF DISCHARGE:  06/29/2003                                 DISCHARGE SUMMARY   ADMISSION DIAGNOSIS:  L4-L5 recurrent herniated disk with degenerative disk  disease and foraminal stenosis.   FINAL DIAGNOSIS:  L4-L5 recurrent herniated disk with degenerative disk  disease and foraminal stenosis.   CLINICAL HISTORY:  The patient was admitted because of back pain with  radiation to both legs.  Previously, she had a 4-5 diskectomy.  The patient  continued to get worse.  X-rays showed degenerative disk disease at the  level of 4-5 with stenosis.  Surgery was advised.  Laboratory normal.   COURSE IN THE HOSPITAL:  The patient was taken to surgery and had bilateral  4-5 diskectomy with interbody fusion and pedicle screw was done.  Today, she  is feeling better.  She is walking.  The wound looks fine.  She is ready to  go home.   CONDITION ON DISCHARGE:  Improving.   MEDICATIONS:  Percocet, diazepam, Neurontin.   DIET:  Regular.   ACTIVITY:  She is not to drive.  She is not to do any heavy lifting.   FOLLOWUP:  To be seen by me in five weeks.                                                Hilda Lias, M.D.    EB/MEDQ  D:  06/29/2003  T:  06/30/2003  Job:  [id number]

## 2011-03-07 NOTE — Op Note (Signed)
[name], [name]                          ACCOUNT NO.:  [id number]   MEDICAL RECORD NO.:  [id number]                   PATIENT TYPE:  INP   LOCATION:  3028                                 FACILITY:  [facility]   PHYSICIAN:  Hilda Lias, M.D.                DATE OF BIRTH:  [birth year]   DATE OF PROCEDURE:  06/23/2003  DATE OF DISCHARGE:                                 OPERATIVE REPORT   PREOPERATIVE DIAGNOSES:  Bilateral L4 and L5 radiculopathy, degenerative  disk disease at L4-5, status post L4-5 diskectomy.   POSTOPERATIVE DIAGNOSES:  Bilateral L4 and L5 radiculopathy, degenerative  disk disease at L4-5, status post L4-5 diskectomy.   PROCEDURES:  1. Bilateral L4 laminectomy.  2. Bilateral L4-5 diskectomy.  3. Decompression of the L4 and L5 nerve root.  4. Lysis of adhesions.  5. Interbody fusion with allograft.  6. Posterolateral fusion with the patient's bone graft.  7. Pedicle screws from L4 to L5.  8. Cell Saver.  9. C-arm.   CLINICAL HISTORY:  [name] is a 75 year old female complaining of back  pain with radiation down to both her legs.  Around __________, she underwent  surgery at the level of L4-5.  She did well for awhile, but now she is  complaining of pain to both legs.  She has failed with conservative  treatment.  An x-ray shows a severe case of degenerative disk disease at the  level of L4-5.  The patient wanted to proceed with surgery because the pain  was so bad.  The risks were explained in the history and physical.   DESCRIPTION OF PROCEDURE:  The patient was taken to the OR, and she was  positioned in a prone manner.  The back was prepped with Betadine.  A  midline incision from L4 to L5 was made.  Muscle was retracted laterally.  We identified the L4-5 space and then with Stille rongeur we removed the  lamina of L4 and all of the spinous process.  The patient had quite a bit of  scar tissue.  The left was __________.  Lysis was accomplished.  At  the end  we were able to retract the L5 nerve root, and we entered the disk space.  Total gross diskectomy was accomplished.  Then using the curette we removed  the end plate.  Then two pieces of bone graft of 10 x 24 were inserted with  her own patient's bone used to fill out the space medially bilaterally.  Then we went laterally.  We identified the transverse process of L4-5.  The  periosteum was removed.  Then the patient's bone graft was introduced also  in that area.  Then with the C-arm we visualized the pedicle of L4 and L5.  Tap was made at those four levels and four pedicle screws of 6.5 x 40 were  introduced.  This was done with the  C-arm, and the AP and lateral view  showed good position of the pedicle screws.  From then on the area was  irrigated and a rod was used bilaterally for the pedicle screws.  The  investigation of the foramen showed plenty of room for the L4 and L5 nerve  roots.  From then on the area was irrigated and the wound was closed with  Vicryl and a Steri-Strip.                                              Hilda Lias, M.D.   EB/MEDQ  D:  06/23/2003  T:  06/24/2003  Job:  [id number]

## 2011-03-07 NOTE — H&P (Signed)
NAME:  Shirley Hamilton, Shirley Hamilton                          ACCOUNT NO.:  [id number]   MEDICAL RECORD NO.:  [id number]                   PATIENT TYPE:  INP   LOCATION:  3028                                 FACILITY:  [facility]   PHYSICIAN:  Hilda Lias, M.D.                DATE OF BIRTH:  1935/11/21   DATE OF ADMISSION:  06/23/2003  DATE OF DISCHARGE:                                HISTORY & PHYSICAL   HISTORY OF PRESENT ILLNESS:  Ms. [name] is a lady who underwent a left L4-5  diskectomy about six months ago. The patient at that time complained of no  pain in the right leg and the patient underwent surgery and did really well  up to the point when she went into __________ C3.  Now, she came back to my  office complaining of pain in the right leg which got worse, and from then  on, the pain started going back to the left leg.  She is quite miserable,  she has had conservative treatment, and she is not any better. We did workup  including MRI and lumbar spine plate, and because of findings, she wanted to  proceed with surgery.   PAST MEDICAL HISTORY:  Anterior cervical fusion and lumbar diskectomy of L4-  5.   SOCIAL HISTORY:  The patient does not smoke, and she drinks socially.   FAMILY HISTORY:  Unremarkable.   REVIEW OF SYSTEMS:  Positive for high cholesterol, back and bilateral leg  pain.   MEDICATIONS:  She is taking Imitrex and Percocet for migraines.   PHYSICAL EXAMINATION:  GENERAL:  The patient came to my office with her  husband, and she was quite miserable, still having quite a bit of difficulty  with both legs.  HEAD, EYES, EARS, NOSE AND THROAT:  Normal.  NECK:  Normal. Scar in the left side from previous surgery.  ABDOMEN: Normal.  CARDIOVASCULAR:  Normal.  EXTREMITIES:  Normal.  NEUROLOGICAL:  Mental status normal. Cranial nerves normal. Strength 5/5  with some weakness of dorsiflexion in both feet. Straight leg raise is  positive bilateral at 60 degrees.  She has a  decrease of flexibility of the  lumbar spine with a previous scar in the lumbar area.   STUDIES:  The MRI showed that she has a severe case of degenerative disk  disease at L4-5 with borderline stenosis at the level of 4-5 with a  borderline stenosis at the level of 3-4.   CLINICAL IMPRESSION:  L4-5 degenerative disk disease with foraminal stenosis  and bilateral chronic L4 radiculopathy. Borderline stenosis at L3-4.    RECOMMENDATIONS:  [name] is being admitted for surgery. The procedure  will be bilateral 4-5 diskectomy, interbody fusion, pedicle screws,  posterolateral fusion. She knows all of the risks such as infection, CSF  leak, worsening pain, paralysis, __________ from the abdomen, and need for  further surgery.  Hilda Lias, M.D.    EB/MEDQ  D:  06/23/2003  T:  06/24/2003  Job:  [id number]

## 2011-03-07 NOTE — Assessment & Plan Note (Signed)
This is followup visit. Date of last visit is September 03, 2006. She has  undergone left T12, L1 and L2 medial branch block and radiofrequency  procedure May 28, 2006 and right-sided T12, L1, L2 medial branch block  and radiofrequency procedure April 20, 2006. She has had no recurrence of  her usual back pain. She is doing well overall. However, feels that she  has some difficulty with bending motions, picking things up. We  discussed physical therapy last time, but has not followed through, but  is willing to do so at this time.   She has good relief from her current medications. She does well with her  feeding, dressing, bathing, toileting, but needs some assistance with  meal prep from time-to-time.   No new medical problems noted.   Her blood pressure is 136/80, pulse is 93, respiratory rate is 16, O2  sat is 98% on room air.  In general, well-developed, well-nourished female in no acute distress.  Oriented x 3. Is alert. Gait is normal. Her back has no tenderness to  palpation along the lumbar spine. She has flexion/extension at about 50%  each in terms of range of motion. Her lower extremity strength and range  of motion is normal and her deep tendon reflexes are 1 bilaterally in  the knees and ankles.   IMPRESSION:  Lumbar facet arthropathy L2-3, L3-4 levels, status post L4-  5 fusion.   PLAN:  1. Refer for physical therapy, core stabilization and lower extremity      strengthening.  2. No need for repeat radiofrequency at this time.  3. Continue oxycodone 5 mg t.i.d., continue Flexeril 5 mg t.i.d. and      continue diazepam 5 mg b.i.d.      Erick Colace, M.D.  Electronically Signed     AEK/MedQ  D:  12/14/2006 16:33:36  T:  12/14/2006 22:27:01  Job #:  [id number]

## 2011-03-07 NOTE — Group Therapy Note (Signed)
HISTORY:  [name] who is kindly referred by Dr. Jeral Hamilton  for evaluation of low back pain.  She has had both neck pain and low back  pain dating back to the 1980s.  She had a C5-6, C6-7 laminectomy fusion  October 07, 1988 per Dr. Jeral Hamilton.  She had another neck surgery prior to  that time with Dr. Vear Hamilton.  Her neck pain got better after having  nonselective cervical epidural steroid injection x3 in July and August 2006.  In terms of her low back pain, she has had left L4-5 discectomy January 17, 2003 and an L4-5 fusion and lumbar laminectomy July 20, 2003.  She has  had a recent cervical and lumbar myelogram.  The cervical myelogram showed  disk narrowing at C5-6 and a solid C6-7 fusion.  The lumbar myelogram showed  a solid L4-5 fusion but two-level stenosis at L2-3 and L3-4 with severe  facet hypertrophy.   Average pain is 8/10.  Pain with general activity 6, relationship with other  people 6, enjoyment of life 8.  She can walk 20 minutes at a time, she  climbs steps, she drives but no longer plays tennis, she is retired, has  problem carrying heavy objects when shopping.   REVIEW OF SYSTEMS:  Positive for depression and spasms, limb swelling,  reflexes, night sweats.  She does follow with primary physician Dr. Merlene Hamilton and has seen Dr. Jeral Hamilton for surgical followup.   SOCIAL HISTORY:  She is a married to a retired Careers adviser, drinks about 4  ounces of bourbon a day.  She had an elevation of liver function tests when  she was taking Percocet regularly but these have resolved after  discontinuation of regular usage.  She does not take Percocet or Percodan on  any regular basis.  She does have 87 Percodan left, these were filled on  July 28, 2005; she has 49 oxycodone filled June 11, 2005; she has 89  Percocet 7.5 filled July 03, 2005.   EXAMINATION:  Blood pressure 124/77, pulse 89, respiratory rate 16, O2  saturation 96% room air.   Mood and  affect is appropriate.  Mentation is normal.  She gives good  historical content.  Speech is without evidence of slurring.  Cranial nerves  II-XII intact.  Motor strength is 5/5 bilateral deltoids, biceps, triceps,  grips, hip flexors, knee extensors, ankle dorsiflexors.  She is able to toe  walk/heel walk.  She has tenderness with palpation in the paraspinal muscle  groups between L2 and L4 bilaterally.  She has increased pain with extension  as well as with flexion and has limited extension at 25% and flexion 50% of  normal range.  Straight leg raising test is negative.  Deep tendon reflexes  are normal.  Sensation is normal in bilateral upper and lower extremities.  Neck range of motion is full in flexion-extension however lateral rotation  is 25% normal range.   IMPRESSION:  1.  Lumbar pain status post L4-5 fusion with paraspinal pain L2-3, L3-4.  I      suspect facet arthropathy above the level of fusion and would like to do      diagnostic bilateral L1, L2, L3 medial branch blocks to denervation      those two segments.  If there is greater than 50% or greater pain relief      on two separate occasions doing these blocks then I think she would      qualify for radiofrequency neurotomy  at that level.  2.  In terms of medication management, she is only inconsistently using      oxycodone for pain, therefore, I would not make any changes and she has      plenty of tablets to utilize on a p.r.n. basis between now and then,      however,      she has signed a controlled substance agreement so that any further      prescriptions will come from this office.  I would consider a long      acting morphine if she does not get adequate relief from facet      denervation.      Shirley Hamilton, M.D.  Electronically Signed     AEK/MedQ  D:  10/03/2005 12:56:24  T:  10/04/2005 08:30:42  Job #:  [id number]   cc:   Shirley Hamilton, M.D.  Fax: [phone]   Shirley Hamilton, M.D.  Fax:  [phone]   Shirley Hamilton, M.D.  Fax: [phone]   Shirley Hamilton, M.D.  Fax: [phone]

## 2011-03-07 NOTE — H&P (Signed)
Shirley Hamilton, Shirley Hamilton                          ACCOUNT NO.:  [id number]   MEDICAL RECORD NO.:  [id number]                   PATIENT TYPE:  INP   LOCATION:  3015                                 FACILITY:  [facility]   PHYSICIAN:  Hilda Lias, M.D.                DATE OF BIRTH:  08/18/1936   DATE OF ADMISSION:  01/17/2003  DATE OF DISCHARGE:                                HISTORY & PHYSICAL   HISTORY OF PRESENT ILLNESS:  The patient is a lady who came to my office  because of back and left leg pain.  The pain is quite intense.  She ended up  being seen in the emergency room.  Nevertheless, she was given some  medication and later on, I saw her in my office.  At that time, we found  weakness on dorsiflexion of the left foot and we have tried with some  conservative treatment.  She is not any better.  The MRI was obtained and  she wants to proceed with surgery.  The patient denies any problem with the  right leg.   PAST MEDICAL HISTORY:   SURGERY:  Anterior cervical fusion.   SOCIAL HISTORY:  She does not smoke and she drinks socially.  She is 5 feet  4 inches, 168 pounds.   FAMILY HISTORY:  Family history unremarkable.   REVIEW OF SYSTEMS:  Review of systems is positive for high cholesterol and  back and left leg pain.   MEDICATIONS:  She is taking Percocet and Imitrex for migraines.   PHYSICAL EXAMINATION:  GENERAL:  This is a patient who came to my office  with her husband and she was limping from the left leg.  HEENT:  Normal.  NECK:  She is able to flex but extension circumferentially revealed  discomfort.  There is a scar anteriorly on the left side of the neck.  LUNGS:  Lungs are clear.  HEART:  Heart sounds normal.  ABDOMEN:  Normal.  EXTREMITIES:  Normal pulses.  NEUROLOGIC:  Mental status normal.  Cranial nerves normal.  Her strength is  5/5 except that she has weakness on dorsiflexion in left foot.  Reflexes are  symmetrical.  Straight leg raising is negative  bilaterally at about 80  degrees.  The femoral stretch maneuver is positive on the left side.  She  has some numbness which involves mostly the L3 and L4 nerve roots.   LABORATORY AND ACCESSORY CLINICAL DATA:  The MRI showed that she has some  stenoses at the level of 2-3 and 3-4, where they are mild, but at the level  of 4-5, there is a lateral herniated disk compromising the L4 nerve root.   CLINICAL IMPRESSION:  Left L4-L5 herniated disk with L4 radiculopathy.    RECOMMENDATION:  The patient is being admitted for surgery.  The procedure  will be an L4-L5 diskectomy using the Medrix system, plus the C-arm  and the  microscope.  She and her husband, who is a Careers adviser, know about the risks  such as infection, CSF leak, worsening pain and paralysis, need for further  surgery.                                               Hilda Lias, M.D.    EB/MEDQ  D:  01/17/2003  T:  01/18/2003  Job:  [id number]

## 2011-03-07 NOTE — Assessment & Plan Note (Signed)
DATE OF VISIT:  April 20, 2006   REASON FOR VISIT:  Ms. Shirley Hamilton returns today.  She underwent a radiofrequency  neurotomy on right side at T12, L1, L2 medial branches under fluoroscopic  guidance.  She has had good pain relief with lumbar medial branch blocks at  levels than bilaterally.  Immediately after the procedure, she did have some  pain relief that went from an 8/10 to a 5/10, but states that her pain is a  9/10 currently.  Pain is confined to actual spine.  She has had good results  with Ultram ER 100 mg a day which she takes in addition to her oxycodone 5  mg t.i.d.   She denies any new injuries.  She is anticipating increased activity because  of her husband getting total knee replacement.  She did attempt physical  therapy at [facility] and states that some pelvic tilt  exercises really aggravated her pain.   REVIEW OF SYSTEMS:  She denies any lower extremity problems, bowel or  bladder problems.   PHYSICAL EXAMINATION:  VITAL SIGNS:  Blood pressure 102/43, pulse 89,  respirations 16, O2 saturations 96% on room air.  GENERAL:  In no acute distress.  Mood and affect appropriate.  BACK:  Nontender to palpation.  NEUROLOGIC:  She is able to forward flex without much difficulty and extend  without much difficulty.  Her lower extremity strength is normal.   ASSESSMENT/PLAN:  1.  Lumbar axial pain.  Exam is benign.  Range of motion is good.  At this      point, I would hold off on lumbar radiofrequency.  I think she has had      some overall exacerbation of her back pain, but it sounds like it was      aggravated during more flexion type or course stabilization exercise      rather than some type of extension.  I will see her back in 1 month.  2.  Continue Ultram ER 100 mg per day.  3.  If not much better next month, would get lumbar flexion/extension films.  4.  If her pain localizes more toward the left, would consider either repeat      lumbar medial branch  blocks versus radiofrequency.      Erick Colace, M.D.  Electronically Signed     AEK/MedQ  D:  04/20/2006 17:23:03  T:  04/20/2006 19:43:31  Job #:  [id number]

## 2011-03-07 NOTE — Procedures (Signed)
Shirley Hamilton, Shirley Hamilton                ACCOUNT NO.:  [id number]   MEDICAL RECORD NO.:  [id number]          PATIENT TYPE:  REC   LOCATION:  TPC                          FACILITY:  [facility]   PHYSICIAN:  Erick Colace, M.D.DATE OF BIRTH:  [birth year]   DATE OF PROCEDURE:  05/28/2006  DATE OF DISCHARGE:                                 OPERATIVE REPORT   PROCEDURE:  Medial branch blocks and radiofrequency procedure left T12, L1  and L2 done under fluoroscopic guidance.   INDICATIONS FOR PROCEDURE:  Lumbar facet syndrome with improvement of pain  after right-sided T12, L1, L2 medial branch block and radiofrequency  performed two months ago.   Informed consent was obtained after describing risks and benefits of the  procedure to the patient.  Her husband was there as well and she elects to  proceed.  These risks include bleeding, bruising, infection, loss of bowel  and bladder function, temporary or permanent paralysis.  Potential benefits  would be reduction in pain which would be more prolonged compared to medial  branch blocks; i.e., in the 6-18 month range.   She is not on any anticoagulant medications.  She is not on any antibiotics  or being treated for any type of infection.   Informed consent was obtained after describing risks and benefits of the  procedure with the patient.  These include bleeding, bruising, infection,  loss of bowel and bladder function, temporary or permanent paralysis and she  elected to proceed and has given written consent.   Patient placed prone on fluoroscopy table.  Betadine prep, sterile drape, a  25-gauge 1-1/2 inch needle was used and infiltrated  subcutaneous tissue, 1%  lidocaine x2 mL.  Then a 20-gauge 10 cm RF probe with 10 mm active curved  tip was inserted under fluoroscopic guidance, first targeting the left L3  SAP transverse process unction.  Bone contact made, confirmed with lateral  imaging.  Motor stimulation 3 volts demonstrated no  lower extremity twitch  and a solution containing 1 mL of 40 mg/mL Depo-Medrol and 2 mL of 1%  methylparaben-free lidocaine were injected followed by  radiofrequency  lesioning 70 degrees x70 seconds.  Then the left L2 SAP transverse process  junction targeted, bone contact made, confirmed with lateral imaging.  Motor  stimulation x3 volts demonstrated no lower extremity twitch, then 1 mL of  Depo-Medrol lidocaine solution was injected followed by radiofrequency  lesioning 70 degrees x70 second and last the L1 SAP transverse process  junction was targeted, bone contact made, confirmed with lateral imaging.  Motor stimulation 3 volts demonstrated no lower extremity twitch followed by  injection with 1 mL of Depo-Medrol lidocaine solution and RF lesioning at 70  degrees x70 seconds.  Patient tolerated the procedure well.  Pre-injection  pain level was 8/10, post-injection 2/10.  Return in 6-8 weeks for follow-up  at that time.  Would like to get her in aquatic physical therapy.     Erick Colace, M.D.  Electronically Signed    AEK/MEDQ  D:  05/28/2006 11:45:04  T:  05/28/2006 12:39:09  Job:  [id number] 

## 2011-03-07 NOTE — Assessment & Plan Note (Signed)
Thursday, September 03, 2006   HISTORY OF PRESENT ILLNESS:  She underwent left T12, L1-L2 medial branch  block and radio frequency procedure May 28, 2006 and a right-sided T12, L1-  L2 medial branch block and radio frequency procedure performed April 20, 2006.  She has done well post procedure.  She is increasing her activity level but  in fact, able to do more now that her pain is under better control.  She  sometimes she climbs steps, she drives.  She has resumed walking with her  husband now that her husband is recovering from total knee replacement.   She has not gone back to physical therapy because of helping care for her  husband post total knee replacement as well and her premoving activities.   She also admits she has been using something else for migraines.  She states  she uses Imitrex and it has been effective, but very expensive.  Her  migraines' frequency is less than every month.  She does see Dr. Thad Ranger  for this.  She occasionally uses the Oxycodone prescribed at this office for  her headache pain as well.   PHYSICAL EXAMINATION:  GENERAL:  No acute distress.  Mood and affect  appropriate.  Pain level 6/10.  VITAL SIGNS:  Blood pressure 123/58, pulse 104, respirations 16, O2  saturation 96% on room air.  NEUROLOGIC:  Gait is normal.  Affect is bright and alert.  She is in no  acute distress.  EXTREMITIES:  She has no tenderness to palpation of the lumbar paraspinals.  She has full range of motion forward flexion and extension.   IMPRESSION:  1. Lumbar facet arthropathy, levels in both L4-5 fusion.  I think she can      benefit from physical therapy, formalizing an exercise program, given      her underlying fusion with the spine neutral activities.  2. In terms of her radio frequency, it has been working three or four      months for now.  Usual duration is effective 6-18 months.  Can repeat      as needed.   I will see her back in approximately three months.  nursing visit in one  month, and she will see neurology for headaches.      Erick Colace, M.D.  Electronically Signed     AEK/MedQ  D:  09/03/2006 16:45:22  T:  09/03/2006 22:27:48  Job #:  [id number]   cc:   Erasmo Leventhal, M.D.  Fax: [phone]   Hilda Lias, M.D.  Fax: [phone]   Hal T. Stoneking, M.D.  Fax: [phone]   Marolyn Hammock. Thad Ranger, M.D.  Fax: [phone]

## 2011-03-24 ENCOUNTER — Encounter: Payer: Medicare Other | Attending: Physical Medicine & Rehabilitation

## 2011-03-24 ENCOUNTER — Ambulatory Visit: Payer: MEDICARE | Admitting: Physical Medicine & Rehabilitation

## 2011-03-24 ENCOUNTER — Encounter (HOSPITAL_BASED_OUTPATIENT_CLINIC_OR_DEPARTMENT_OTHER): Payer: Medicare Other | Admitting: Physical Medicine & Rehabilitation

## 2011-03-24 DIAGNOSIS — M545 Low back pain, unspecified: Secondary | ICD-10-CM | POA: Insufficient documentation

## 2011-03-24 DIAGNOSIS — M79609 Pain in unspecified limb: Secondary | ICD-10-CM | POA: Insufficient documentation

## 2011-03-24 DIAGNOSIS — IMO0002 Reserved for concepts with insufficient information to code with codable children: Secondary | ICD-10-CM

## 2011-03-25 NOTE — Procedures (Signed)
Shirley Hamilton, Shirley Hamilton                ACCOUNT NO.:  [id number]  MEDICAL RECORD NO.:  [id number]          PATIENT TYPE:  REC  LOCATION:  TPC                          FACILITY:  [facility]  PHYSICIAN:  Erick Colace, M.D.DATE OF BIRTH:  1935/10/24  DATE OF PROCEDURE:  03/24/2011 DATE OF DISCHARGE:  11/19/2010                              OPERATIVE REPORT  PROCEDURE:  Right L2-3 transforaminal lumbar epidural steroid injection under fluoroscopic guidance.  INDICATION:  Lumbar pain extending into the right thigh.  Pain is only partially responsive to medication management and other conservative care.  Informed consent was obtained after describing risks and benefits of the procedure with the patient.  These include bleeding, bruising and infection as well as temporary or permanent paralysis.  She elects to proceed and has given written consent.  The patient placed prone on fluoroscopy table.  Betadine prep, sterile drape, 25-gauge inch and half needle was used to anesthetize the skin and subcutaneous tissue 1% lidocaine x2 mL.  Then, a 22-gauge, 3-1/2-inch spinal needle was inserted under fluoroscopic guidance into the L2-3 intervertebral foramen.  AP, lateral and oblique images utilized.  Omnipaque 180 under live fluoro demonstrated no intravascular uptake.  Good epidural and nerve root spread, followed by injection of 1 mL of 10 mg/mL dexamethasone and 2 mL of 1% MPF lidocaine.  The patient tolerated the procedure well.  Postprocedure instructions given.  I will see her back in nurse practitioner visit in 1 month and I will see her back in 3 months.     Erick Colace, M.D. Electronically Signed    AEK/MEDQ  D:  03/24/2011 13:50:13  T:  03/25/2011 01:28:18  Job:  [id number]

## 2011-04-21 ENCOUNTER — Encounter: Payer: Medicare Other | Attending: Neurosurgery | Admitting: Neurosurgery

## 2011-04-21 DIAGNOSIS — M545 Low back pain, unspecified: Secondary | ICD-10-CM | POA: Insufficient documentation

## 2011-04-21 DIAGNOSIS — M51379 Other intervertebral disc degeneration, lumbosacral region without mention of lumbar back pain or lower extremity pain: Secondary | ICD-10-CM | POA: Insufficient documentation

## 2011-04-21 DIAGNOSIS — M25559 Pain in unspecified hip: Secondary | ICD-10-CM | POA: Insufficient documentation

## 2011-04-21 DIAGNOSIS — M5137 Other intervertebral disc degeneration, lumbosacral region: Secondary | ICD-10-CM | POA: Insufficient documentation

## 2011-04-22 ENCOUNTER — Encounter (HOSPITAL_BASED_OUTPATIENT_CLINIC_OR_DEPARTMENT_OTHER): Payer: Medicare Other | Admitting: Neurosurgery

## 2011-04-22 DIAGNOSIS — G894 Chronic pain syndrome: Secondary | ICD-10-CM

## 2011-04-22 DIAGNOSIS — IMO0002 Reserved for concepts with insufficient information to code with codable children: Secondary | ICD-10-CM

## 2011-05-01 NOTE — Assessment & Plan Note (Signed)
HISTORY OF PRESENT ILLNESS:  This is a patient of Dr. Wynn Banker who is seen for status post L4-L5 fusion.  She has had a chronic low back pain ever since then.  She has also undergone several injections of different types over the past several months.  She follows up today with complaints of pain level of 9.  She states it is a sharp stabbing pain, unchanged from previous.  General activity level is 7-8.  Pain is worse in the morning and during the day.  Sitting, standing tend to aggravate. Medications help on a fair level.  She can walk without assistance.  She does climb steps and drive.  REVIEW OF SYSTEMS:  Notable for difficulties as described above, otherwise within normal limits.  PAST MEDICAL HISTORY:  Unchanged.  SOCIAL HISTORY:  She is married.  She still lives with her husband.  FAMILY HISTORY:  Unchanged.  PHYSICAL EXAMINATION:  VITAL SIGNS:  Blood pressure 106/64, pulse 72, respirations 18, and O2 sats 94% on room air. NEUROLOGIC:  Constitutionally, she is within normal limits.  She is alert and oriented x3.  She does walk with a mild limp.  Motor strength is within normal limits.  Sensation is intact.  She has a limited range of motion with flexion and extension.  ASSESSMENT: 1. Right thigh pain. 2. Low back pain.  PLAN:  We will refill her oxycodone 5 mg 1 p.o. q.i.d. 120 with no refill.  We did electronic refills for the rest of her medicines including Valium and Flexeril.  She will let us know when she needs gabapentin.  Questions were encouraged and answered.  She will follow up here in 3 months.     Nehemiah Mcfarren L. Blima Dessert Electronically Signed    RLW/MedQ D:  04/22/2011 11:20:05  T:  04/23/2011 02:32:37  Job #:  [id number]

## 2011-05-20 ENCOUNTER — Ambulatory Visit: Payer: Medicare Other | Admitting: Physical Medicine & Rehabilitation

## 2011-05-20 ENCOUNTER — Encounter: Payer: Medicare Other | Attending: Physical Medicine & Rehabilitation

## 2011-05-20 DIAGNOSIS — Z981 Arthrodesis status: Secondary | ICD-10-CM | POA: Insufficient documentation

## 2011-05-20 DIAGNOSIS — M47817 Spondylosis without myelopathy or radiculopathy, lumbosacral region: Secondary | ICD-10-CM | POA: Insufficient documentation

## 2011-05-21 ENCOUNTER — Encounter: Payer: Medicare Other | Attending: Neurosurgery | Admitting: Neurosurgery

## 2011-05-21 DIAGNOSIS — M545 Low back pain, unspecified: Secondary | ICD-10-CM | POA: Insufficient documentation

## 2011-05-21 DIAGNOSIS — M25559 Pain in unspecified hip: Secondary | ICD-10-CM | POA: Insufficient documentation

## 2011-05-21 DIAGNOSIS — M5137 Other intervertebral disc degeneration, lumbosacral region: Secondary | ICD-10-CM | POA: Insufficient documentation

## 2011-05-21 DIAGNOSIS — M51379 Other intervertebral disc degeneration, lumbosacral region without mention of lumbar back pain or lower extremity pain: Secondary | ICD-10-CM | POA: Insufficient documentation

## 2011-05-21 NOTE — Assessment & Plan Note (Signed)
This is a patient of Dr. Lunette Stands who was scheduled to see him yesterday and missed her appointment.  She reports a little change in her condition.  She is here for med refill.  Her average pain is 8 or 9. It is a sharp, stabbing-type pain.  General activity level a 6 to an 8. Pain is worse in the morning and evening.  Sleep patterns are poor. Standing tends to aggravate and medication helps.  She climbs steps and drives.  She is independent with ambulation.  Functionally, she is retired.  She does need help with meal prep and household duties.  REVIEW OF SYSTEMS:  Notable for those difficulties described above as well as depression.  No suicidal thoughts or aberrant behaviors.  PAST MEDICAL HISTORY:  Unchanged.  SOCIAL HISTORY:  She is married, lives with her husband who is a retired Development worker, community and has Parkinson.  FAMILY HISTORY:  Unchanged.  PHYSICAL EXAMINATION:  VITAL SIGNS:  Blood pressure is 125/76, pulse 67, respirations 18, O2 sats 96 on room air. CONSTITUTIONAL:  She is within normal limits. NEUROLOGIC:  She is alert and oriented x3.  Motor strength is good.  Her sensation is intact.  Her Oswestry score is 66.  ASSESSMENT: 1. History of right thigh pain. 2. Low back pain, degenerative disk disease.  PLAN: 1. She spoke with me regarding followup with Dr. Jeral Fruit as she may     possibly end up having surgery again with him.  She is going to     request maybe to be seen by Dr. Danielle Dess and I have instructed her to     call the office and make that request herself. 2. Gabapentin 600 mg increased by Dr. Wynn Banker 1 p.o. at bedtime, 30     with 3 refills. 3. Oxycodone 5 mg 1 p.o. q.i.d., 120 with no refills. 4. She will follow up with Dr. Wynn Banker on or about June 06, 2011,     for an epidural steroid injection and she will put that scheduled     today.     Tyriq Moragne L. Blima Dessert Electronically Signed    RLW/MedQ D:  05/21/2011 10:05:23  T:  05/21/2011 10:55:43   Job #:  [id number]

## 2011-06-06 ENCOUNTER — Ambulatory Visit: Payer: Medicare Other | Admitting: Physical Medicine & Rehabilitation

## 2011-06-12 ENCOUNTER — Ambulatory Visit (HOSPITAL_BASED_OUTPATIENT_CLINIC_OR_DEPARTMENT_OTHER)
Admission: RE | Admit: 2011-06-12 | Discharge: 2011-06-12 | Disposition: A | Discharge status: Home / Self Care | Payer: Medicare Other | Source: Ambulatory Visit | Attending: Physical Medicine & Rehabilitation | Admitting: Physical Medicine & Rehabilitation

## 2011-06-12 ENCOUNTER — Encounter: Payer: Medicare Other | Attending: Physical Medicine & Rehabilitation

## 2011-06-12 ENCOUNTER — Ambulatory Visit: Payer: Medicare Other | Admitting: Physical Medicine & Rehabilitation

## 2011-06-12 DIAGNOSIS — M545 Low back pain, unspecified: Secondary | ICD-10-CM | POA: Insufficient documentation

## 2011-06-12 DIAGNOSIS — M47817 Spondylosis without myelopathy or radiculopathy, lumbosacral region: Secondary | ICD-10-CM | POA: Insufficient documentation

## 2011-06-12 DIAGNOSIS — Z981 Arthrodesis status: Secondary | ICD-10-CM | POA: Insufficient documentation

## 2011-06-12 DIAGNOSIS — IMO0002 Reserved for concepts with insufficient information to code with codable children: Secondary | ICD-10-CM | POA: Insufficient documentation

## 2011-06-12 NOTE — Procedures (Signed)
NAMEILINA, XU                ACCOUNT NO.:  [id number]  MEDICAL RECORD NO.:  [id number]           PATIENT TYPE:  O  LOCATION:  PRM                          FACILITY:  [facility]  PHYSICIAN:  Erick Colace, M.D.DATE OF BIRTH:  1936-06-06  DATE OF PROCEDURE: DATE OF DISCHARGE:  05/21/2011                              OPERATIVE REPORT  PROCEDURE:  Right paramedian L2-3 translaminar lumbar epidural steroid injection under fluoroscopic guidance.  INDICATIONS:  Right L2-3 lumbar radiculitis.  Pain is only partially responsive to medication management and other conservative care.  She had a 1-day relief after transforaminal approach at L2-3.  Informed consent was obtained after describing risks and benefits of this procedure with the patient.  These include bleeding, bruising and infection.  She elects to proceed and has given written consent.  The patient placed prone on fluoroscopy table.  Betadine prep, sterile drape, 25-gauge inch and half needle was used to anesthetize the skin and subcutaneous tissue 1% lidocaine x2 mL.  Then, a 17-gauge Tuohy needle was inserted under fluoroscopic guidance after a time-out taken for proper patient in location.  This was advanced under fluoroscopic guidance and once needle tip approximated posterior elements loss-of- resistance technique was utilized using 50:50 air saline mix.  Positive loss-of-resistance was confirmed with Omnipaque 180 x 2 mL demonstrated good epidural spread, followed by injection of 2 mL of 40 mg Depo-Medrol and 2 mL 1% lidocaine preservative-free.  The patient tolerated the procedure well.  Postprocedure instructions given.  She will follow up with in one month.     Erick Colace, M.D. Electronically Signed    AEK/MEDQ  D:  06/12/2011 11:58:16  T:  06/12/2011 12:44:09  Job:  [id number]

## 2011-07-11 ENCOUNTER — Encounter: Payer: Medicare Other | Attending: Physical Medicine & Rehabilitation

## 2011-07-11 ENCOUNTER — Ambulatory Visit (HOSPITAL_BASED_OUTPATIENT_CLINIC_OR_DEPARTMENT_OTHER): Payer: Medicare Other | Admitting: Physical Medicine & Rehabilitation

## 2011-07-11 DIAGNOSIS — M47817 Spondylosis without myelopathy or radiculopathy, lumbosacral region: Secondary | ICD-10-CM | POA: Insufficient documentation

## 2011-07-11 DIAGNOSIS — Z981 Arthrodesis status: Secondary | ICD-10-CM | POA: Insufficient documentation

## 2011-07-11 DIAGNOSIS — M48061 Spinal stenosis, lumbar region without neurogenic claudication: Secondary | ICD-10-CM

## 2011-07-11 NOTE — Assessment & Plan Note (Signed)
REASON FOR VISIT:  Right thigh pain and numbness.  HISTORY:  Shirley Hamilton is a 75 year old female with a long history of lumbar pain with post laminectomy syndrome.  She has had good results with radiofrequency enterotomy, has had at T12, L1-L2 medial branches. She has a history of lumbar stenosis with radiculopathy.  She has instrument fusion L4-5, L5-S1.  She has underwent right L3-4 transforaminal epidural steroid injection on Feb 24, 2011 and a right L2- 3 on March 24, 2011.  She had another one right L2-3 on June 12, 2011. She has had some gradually improved right thigh pain and numbness.  She has had the trans laminar approach last visit on June 12, 2011, compared to the transforaminal at L2-3 previous injection.  Her last MRI demonstrated L2-3 and L3-4 disk protrusion causing a spinal stenosis with lateral recess narrowing.  EXAMINATION:  Gait is normal.  She has normal spine flexion, extension is normal as well surprisingly.  She has negative straight leg raising test.  She has decreased sensation in the right L2 and L3 dermatomal distribution.  Her knee reflexes are 1+ bilaterally and 1+ at the ankles.  Hip and knee ankle range of motion are full.  Lower extremity strength is normal at the hip flexor and extensor ankle dorsiflexor.  IMPRESSION:  Lumbar spinal stenosis with chronic L2-3 radiculitis.  She has sensory complaints only.  PLAN: 1. We will continue Neurontin 300 mg q.i.d. 2. Continue oxycodone 5 mg q.i.d. 3. Discontinue Valium 5 mg b.i.d.  Discussed with the patient and agrees with plan.  No further injections planned at this time.  She is starting complained of increasing back pain.  We will repeat RF given that she is about 6 months post we can repeat at this point.  Discussed with the patient, agrees with plan.     Erick Colace, M.D. Electronically Signed    AEK/MedQ D:  07/11/2011 15:07:19  T:  07/11/2011 21:19:23  Job #:  [id number]

## 2011-07-18 LAB — POCT HEMOGLOBIN-HEMACUE: Hemoglobin: 14.9

## 2011-08-11 ENCOUNTER — Encounter: Payer: Medicare Other | Attending: Neurosurgery | Admitting: Neurosurgery

## 2011-08-11 DIAGNOSIS — M48061 Spinal stenosis, lumbar region without neurogenic claudication: Secondary | ICD-10-CM

## 2011-08-11 DIAGNOSIS — M961 Postlaminectomy syndrome, not elsewhere classified: Secondary | ICD-10-CM | POA: Insufficient documentation

## 2011-08-12 NOTE — Assessment & Plan Note (Signed)
This is a patient of Dr. Wynn Banker' who is seen for a postlaminectomy syndrome.  She reports no change in her pain.  She is not interested in any other further procedures right now.  She rates her pain an 8-9.  It is sharp to dull, tingling, aching pain.  General activity level is 5-6. Pain is worse in the evening.  Sleep patterns are poor.  Pain is worse sitting and standing.  Rest and injections tend to help some with the medication.  She walks without assistance.  She can walk about 30 minutes at a time.  She climb steps and drive.  She is retired.  REVIEW OF SYSTEMS:  Notable for difficulties described above as well as some paresthesias, spasm, and depression.  No suicidal thoughts or aberrant behaviors.  Pill count is correct.  PAST MEDICAL HISTORY/SOCIAL HISTORY/FAMILY HISTORY:  Unchanged.  PHYSICAL EXAM:  Blood pressure is 124/69, pulse 65, respirations 14, and O2 sats 94 on room air.  Motor strength and sensation are intact in upper and lower extremities.  Constitutionally, she is within normal limits.  She is alert and oriented x3.  Her gait is normal.  IMPRESSION:  History of lumbar spinal stenosis with L2-3 radiculitis.  PLAN: 1. Refill Valium 5 mg 1 p.o. b.i.d., #60 with no refill. 2. Oxycodone 5 mg 1 p.o. q.i.d., #120 with no refill.  Her questions     were encouraged and answered.  We will see her in a month.     Shirley Hamilton L. Blima Dessert Electronically Signed    RLW/MedQ D:  08/11/2011 14:04:11  T:  08/12/2011 03:39:33  Job #:  [id number]

## 2011-09-09 ENCOUNTER — Encounter: Payer: Medicare Other | Attending: Neurosurgery | Admitting: Neurosurgery

## 2011-09-09 DIAGNOSIS — M48061 Spinal stenosis, lumbar region without neurogenic claudication: Secondary | ICD-10-CM

## 2011-09-09 DIAGNOSIS — G894 Chronic pain syndrome: Secondary | ICD-10-CM

## 2011-09-09 DIAGNOSIS — M961 Postlaminectomy syndrome, not elsewhere classified: Secondary | ICD-10-CM | POA: Insufficient documentation

## 2011-09-09 NOTE — Assessment & Plan Note (Signed)
This is a patient of Dr. Wynn Banker', seen for post laminectomy syndrome. She has no change in her pain.  She rates it 8 or 9.  It is a sharp, aching-type pain.  General activity level is 8.  Pain is same 24 hours a day.  Sleep patterns are poor.  Pain is worse with bending, sitting, and standing.  Medication helps some.  She walks without assistance.  She does climb steps and drive.  Functionally, she is retired.  REVIEW OF SYSTEMS:  Notable for difficulties described above, otherwise within normal limits.  No signs of aberrant behaviors.  Last pill count and UDS consistent.  PAST MEDICAL HISTORY:  Unchanged.  SOCIAL HISTORY:  Unchanged.  FAMILY HISTORY:  Unchanged.  PHYSICAL EXAMINATION:  VITAL SIGNS:  Blood pressure is 99/60, pulse 71, respirations 18, O2 sats 98 on room air. NEUROLOGIC:  Motor strength and sensation are intact. CONSTITUTIONAL:  She is within normal limits.  She is alert and oriented x3.  She has a normal gait.  ASSESSMENT:  History of lumbar spinal stenosis with L2-L3 radiculitis, history of lumbar laminectomy.  PLAN: 1. Refill Valium 5 mg 1 p.o. b.i.d., 60 with no refill. 2. Oxycodone 5 mg 1 p.o. q.i.d., 120 with no refill.  Questions were     encouraged and answered.  I will see her back here in a month.     Brogen Duell L. Blima Dessert Electronically Signed    RLW/MedQ D:  09/09/2011 11:04:37  T:  09/09/2011 19:33:47  Job #:  [id number]

## 2011-09-30 ENCOUNTER — Encounter: Payer: Medicare Other | Attending: Physical Medicine & Rehabilitation

## 2011-09-30 ENCOUNTER — Ambulatory Visit (HOSPITAL_BASED_OUTPATIENT_CLINIC_OR_DEPARTMENT_OTHER): Payer: Medicare Other | Admitting: Physical Medicine & Rehabilitation

## 2011-09-30 DIAGNOSIS — M47817 Spondylosis without myelopathy or radiculopathy, lumbosacral region: Secondary | ICD-10-CM | POA: Insufficient documentation

## 2011-09-30 DIAGNOSIS — M2390 Unspecified internal derangement of unspecified knee: Secondary | ICD-10-CM

## 2011-09-30 DIAGNOSIS — Z981 Arthrodesis status: Secondary | ICD-10-CM | POA: Insufficient documentation

## 2011-09-30 DIAGNOSIS — IMO0002 Reserved for concepts with insufficient information to code with codable children: Secondary | ICD-10-CM

## 2011-10-01 NOTE — Procedures (Signed)
NAMEANTONIA, Shirley Hamilton                ACCOUNT NO.:  [id number]  MEDICAL RECORD NO.:  [id number]           PATIENT TYPE:  O  LOCATION:  [facility]                          FACILITY:  [facility]  PHYSICIAN:  Rollo Farquhar L. Allene Furuya, ANP-CDATE OF BIRTH:  09/19/1936  DATE OF PROCEDURE:  09/30/2011 DATE OF DISCHARGE:                              OPERATIVE REPORT  This is a patient of Dr. Wynn Banker.  Today, he asked me to inject her right knee for continued knee pain.  After informed consent, risks and benefits were explained.  I injected the right knee with 3 mL of lidocaine, 1 mL with 40 mg of Depo-Medrol.  She tolerated well.  There is no blood drawback, no bleeding after she knows to ice the area tonight.  Her questions were encouraged and answered and we will see her back as Dr. Wynn Banker has her scheduled.     Kaladin Noseworthy L. Blima Dessert Electronically Signed    RLW/MEDQ  D:  09/30/2011 10:43:39  T:  10/01/2011 00:17:02  Job:  [id number]

## 2011-10-07 ENCOUNTER — Ambulatory Visit: Payer: Medicare Other | Admitting: Neurosurgery

## 2011-10-08 ENCOUNTER — Ambulatory Visit: Payer: Medicare Other | Admitting: Neurosurgery

## 2011-10-20 ENCOUNTER — Ambulatory Visit (HOSPITAL_BASED_OUTPATIENT_CLINIC_OR_DEPARTMENT_OTHER): Payer: Medicare Other | Admitting: Physical Medicine & Rehabilitation

## 2011-10-20 DIAGNOSIS — IMO0002 Reserved for concepts with insufficient information to code with codable children: Secondary | ICD-10-CM

## 2011-12-02 ENCOUNTER — Encounter: Payer: Medicare Other | Attending: Physical Medicine & Rehabilitation

## 2011-12-02 ENCOUNTER — Ambulatory Visit (HOSPITAL_BASED_OUTPATIENT_CLINIC_OR_DEPARTMENT_OTHER): Payer: Medicare Other | Admitting: Physical Medicine & Rehabilitation

## 2011-12-02 ENCOUNTER — Ambulatory Visit: Payer: Medicare Other | Admitting: Neurosurgery

## 2011-12-02 DIAGNOSIS — M48061 Spinal stenosis, lumbar region without neurogenic claudication: Secondary | ICD-10-CM

## 2011-12-02 DIAGNOSIS — M47817 Spondylosis without myelopathy or radiculopathy, lumbosacral region: Secondary | ICD-10-CM | POA: Insufficient documentation

## 2011-12-02 DIAGNOSIS — IMO0002 Reserved for concepts with insufficient information to code with codable children: Secondary | ICD-10-CM

## 2011-12-02 DIAGNOSIS — Z981 Arthrodesis status: Secondary | ICD-10-CM | POA: Insufficient documentation

## 2011-12-02 NOTE — Assessment & Plan Note (Signed)
REASON FOR VISIT:  Lumbar spinal stenosis and chronic radiculitis.  She has also had some knee pain, responded well to right knee injection last visit, September 30, 2011.  Her last epidural injection was helpful and was performed on October 20, 2011.  Her concerns are about preauthorization for Valium.  She takes 5 mg, sometimes twice a day, sometimes once a day, sometimes not at all.  She is doing well on her oxycodone 5 mg q.i.d.  She brought in some lab work which I reviewed today.  This is from Dr. Corliss Skains.  She had a normal CMET, normal vitamin D, and elevated hemoglobin, which she states is chronic.  PHYSICAL EXAMINATION:  GENERAL:  No acute distress.  Mood and affect appropriate. MUSCULOSKELETAL:  Her back range of motion is full.  She has no tenderness to palpation.  Her lower extremities have decreased reflex in the right.  Knee is normal in left knee.  Decreased bilateral ankle even with facilitation.  Hip, knee, and ankle range of motion are full. Lower extremity strength is normal.  IMPRESSION:  Lumbar spinal stenosis, chronic L3 radiculopathy.  Overall better after epidural injection, not requiring quite as much gabapentin during the day, mainly takes it at night.  I will see her back in March. We will repeat the epidural, assuming that her symptoms will have increased again by that time and we will continue current medications.     Erick Colace, M.D. Electronically Signed    AEK/MedQ D:  12/02/2011 13:42:10  T:  12/02/2011 14:25:43  Job #:  [id number]

## 2012-01-12 ENCOUNTER — Encounter: Payer: Self-pay | Admitting: Physical Medicine & Rehabilitation

## 2012-01-12 ENCOUNTER — Ambulatory Visit (HOSPITAL_BASED_OUTPATIENT_CLINIC_OR_DEPARTMENT_OTHER): Payer: Medicare Other | Admitting: Physical Medicine & Rehabilitation

## 2012-01-12 ENCOUNTER — Encounter: Payer: Medicare Other | Attending: Physical Medicine & Rehabilitation

## 2012-01-12 VITALS — BP 126/54 | HR 83 | Resp 16 | Ht 64.0 in | Wt 145.6 lb

## 2012-01-12 DIAGNOSIS — IMO0002 Reserved for concepts with insufficient information to code with codable children: Secondary | ICD-10-CM

## 2012-01-12 DIAGNOSIS — M47817 Spondylosis without myelopathy or radiculopathy, lumbosacral region: Secondary | ICD-10-CM | POA: Insufficient documentation

## 2012-01-12 DIAGNOSIS — Z981 Arthrodesis status: Secondary | ICD-10-CM | POA: Insufficient documentation

## 2012-01-12 MED ORDER — OXYCODONE HCL 5 MG PO CAPS: 5.0000 mg | ORAL_CAPSULE | Freq: Four times a day (QID) | ORAL | Status: DC

## 2012-01-12 MED ORDER — DIAZEPAM 5 MG PO TABS: 5.0000 mg | ORAL_TABLET | Freq: Two times a day (BID) | ORAL | Status: DC

## 2012-01-12 NOTE — Patient Instructions (Signed)
Epidural Steroid Injection Care After  Refer to this sheet in the next few weeks. These instructions provide you with information on caring for yourself after your procedure. Your caregiver may also give you more specific instructions. Your treatment has been planned according to current medical practices, but problems sometimes occur. Call your caregiver if you have any problems or questions after your procedure. HOME CARE INSTRUCTIONS   Avoid the use of heat on the injection site for a day.   Do not have a tub bath or soak in water for the rest of the day.   Remove the bandage on the next day.   Resume your normal activities on the next day.   Use ice packs or mild pain relievers to reduce the soreness around the injection site.   Follow up with your caregiver 7 to 10 days after the procedure.  SEEK MEDICAL CARE IF:   You develop a fever of more than 100.5 F (38.1 C).   You continue to have pain and soreness over the injection site even after taking medicines.   You develop significant nausea or vomiting.  SEEK IMMEDIATE MEDICAL CARE IF:   You have severe back pain, which is not relieved by medicines.   You develop severe headache, stiff neck, or sensitivity to light.   You develop any new numbness or weakness of your legs.   You lose control over your bladder or bowel movements.   You develop a fever of more than 102 F (38.9 C).   You develop difficulty breathing.  Document Released: 01/21/2011 Document Revised: 09/25/2011 Document Reviewed: 01/21/2011 [employer] Patient Information 2012 [employer]. 

## 2012-01-12 NOTE — Progress Notes (Signed)
R L3-4 Lumbar epidural steroid injection under fluoroscopic guidance  Indication: Lumbosacral radiculitis is not relieved by medication management or other conservative care and interfering with self-care and mobility.  Informed consent was obtained after describing risk and benefits of the procedure with the patient, this includes bleeding, bruising, infection, paralysis and medication side effects.  The patient wishes to proceed and has given written consent.  Patient was placed in a prone position.  The lumbar area was marked and prepped with Betadine.  It was entered with a 25-gauge 1-1/2 inch needle and one mL of 1% lidocaine was injected into the skin and subcutaneous tissue.  Then a 17-gauge spinal needle was inserted under fluoroscopic guidance into the L3-4 interlaminar space under AP and Lateral imaging.  Once needle tip of approximated the posterior elements, a loss of resistance technique was utilized with lateral imaging.  A positive loss of resistance was obtained and then confirmed by injecting 2 mL's of Omnipaque 180.  Then a solution containing 2 mL's of 40 mg per mL depomedrol and 2 mL's of 1% lidocaine was injected.  The patient tolerated procedure well.  Post procedure instructions were given.  Please se post procedure form.

## 2012-01-12 NOTE — Progress Notes (Signed)
  PROCEDURE RECORD [facility]   Name: Shirley Hamilton DOB:July 29, 1936 MRN: [id number]  Date:01/12/2012  Physician: Claudette Laws, MD    Nurse/CMA:Devanny Palecek RN  Allergies:  Allergies  Allergen Reactions  . Morphine And Related Other (See Comments)    Migraines  . Erythrocin     Consent Signed: yes  Is patient diabetic? no  CBG today?   Pregnant: no LMP: No LMP recorded. Patient is postmenopausal. (age 74-55)  Anticoagulants: no Anti-inflammatory: no Antibiotics: no  Procedure: Translaminar Lumbar Epidural Steroid Injection Position: Prone Start Time: 12:23p End Time:12:29  Fluoro Time: 12 sec  RN/CMA Haematologist RN    Time 11:51` 12:33    BP 126/54 136/66    Pulse 83 71    Respirations 18 18    O2 Sat 96% 97%    S/S 6 6    Pain Level 9/10 0/10     D/C home with Assunta Found, patient A & O X 3, D/C instructions reviewed, and sits independently.

## 2012-01-13 ENCOUNTER — Encounter: Payer: Self-pay | Admitting: Physical Medicine & Rehabilitation

## 2012-01-26 ENCOUNTER — Encounter: Payer: Self-pay | Admitting: *Deleted

## 2012-01-26 NOTE — Progress Notes (Signed)
Prior Authorization initiated for Valium 5 mg tabs. Rec'd 30 day supply on 01/14/12.

## 2012-02-12 ENCOUNTER — Ambulatory Visit (HOSPITAL_BASED_OUTPATIENT_CLINIC_OR_DEPARTMENT_OTHER): Payer: Medicare Other | Admitting: Physical Medicine & Rehabilitation

## 2012-02-12 ENCOUNTER — Other Ambulatory Visit: Payer: Self-pay | Admitting: *Deleted

## 2012-02-12 ENCOUNTER — Encounter: Payer: Medicare Other | Attending: Physical Medicine & Rehabilitation

## 2012-02-12 ENCOUNTER — Encounter: Payer: Self-pay | Admitting: Physical Medicine & Rehabilitation

## 2012-02-12 VITALS — BP 110/47 | HR 69 | Resp 16 | Ht 64.5 in | Wt 149.0 lb

## 2012-02-12 DIAGNOSIS — M961 Postlaminectomy syndrome, not elsewhere classified: Secondary | ICD-10-CM | POA: Insufficient documentation

## 2012-02-12 DIAGNOSIS — Z981 Arthrodesis status: Secondary | ICD-10-CM | POA: Insufficient documentation

## 2012-02-12 DIAGNOSIS — M47817 Spondylosis without myelopathy or radiculopathy, lumbosacral region: Secondary | ICD-10-CM | POA: Insufficient documentation

## 2012-02-12 MED ORDER — DIAZEPAM 5 MG PO TABS: 5.0000 mg | ORAL_TABLET | Freq: Two times a day (BID) | ORAL | Status: DC

## 2012-02-12 MED ORDER — OXYCODONE HCL 5 MG PO CAPS: 5.0000 mg | ORAL_CAPSULE | Freq: Four times a day (QID) | ORAL | Status: DC

## 2012-02-12 MED ORDER — GABAPENTIN 300 MG PO CAPS: 600.0000 mg | ORAL_CAPSULE | Freq: Three times a day (TID) | ORAL | Status: DC

## 2012-02-12 NOTE — Patient Instructions (Signed)
Gabapentin 1-2 three times a day

## 2012-02-12 NOTE — Progress Notes (Signed)
  Subjective:    Patient ID: Shirley Hamilton, female    DOB: 1936-05-22, 76 y.o.   MRN: [id number]  HPI R L3-4 Lumbar epidural steroid injection under fluoroscopic guidance in March 2013 Husband placed in assisted living patient is clearing out the house. She will join him    Pain Inventory Average Pain 8 Pain Right Now 9 My pain is dull and aching  In the last 24 hours, has pain interfered with the following? General activity 6 Relation with others 7 Enjoyment of life 7 What TIME of day is your pain at its worst? morning and evening Sleep (in general) Poor  Pain is worse with: bending, sitting and standing Pain improves with: rest, medication and TENS Relief from Meds: 6  Mobility walk without assistance how many minutes can you walk? 15-30 min ability to climb steps?  yes do you drive?  yes  Function retired  Neuro/Psych depression anxiety  Prior Studies Any changes since last visit?  no  Physicians involved in your care Any changes since last visit?  no       Review of Systems  Constitutional: Positive for unexpected weight change.  Gastrointestinal: Positive for constipation.  All other systems reviewed and are negative.       Objective:   Physical Exam  Constitutional: She is oriented to person, place, and time. She appears well-developed.  Musculoskeletal:       Lumbar back: She exhibits decreased range of motion.       Decreasede ext normal flex  Neurological: She is alert and oriented to person, place, and time. She has normal strength.  Reflex Scores:      Patellar reflexes are 0 on the right side and 2+ on the left side.      Achilles reflexes are 0 on the right side and 2+ on the left side.     Assessment & Plan:  1.  Lumbar post lami syndrome with chronic L3-4 radiculopathy. Continue current dose of oxycodone New prescription for Neurontin 600 mg one half to one by mouth 3 times a day when necessary M.D. visit one month

## 2012-03-09 ENCOUNTER — Ambulatory Visit (HOSPITAL_BASED_OUTPATIENT_CLINIC_OR_DEPARTMENT_OTHER): Payer: Medicare Other | Admitting: Physical Medicine & Rehabilitation

## 2012-03-09 ENCOUNTER — Encounter: Payer: Self-pay | Admitting: Physical Medicine & Rehabilitation

## 2012-03-09 ENCOUNTER — Encounter: Payer: Medicare Other | Attending: Physical Medicine & Rehabilitation

## 2012-03-09 VITALS — BP 153/81 | HR 66 | Resp 16 | Ht 64.5 in | Wt 138.0 lb

## 2012-03-09 DIAGNOSIS — IMO0002 Reserved for concepts with insufficient information to code with codable children: Secondary | ICD-10-CM

## 2012-03-09 DIAGNOSIS — Z981 Arthrodesis status: Secondary | ICD-10-CM | POA: Insufficient documentation

## 2012-03-09 DIAGNOSIS — M47817 Spondylosis without myelopathy or radiculopathy, lumbosacral region: Secondary | ICD-10-CM | POA: Insufficient documentation

## 2012-03-09 DIAGNOSIS — M961 Postlaminectomy syndrome, not elsewhere classified: Secondary | ICD-10-CM

## 2012-03-09 MED ORDER — DIAZEPAM 5 MG PO TABS: 5.0000 mg | ORAL_TABLET | Freq: Two times a day (BID) | ORAL | Status: DC

## 2012-03-09 MED ORDER — GABAPENTIN 300 MG PO CAPS: 600.0000 mg | ORAL_CAPSULE | Freq: Three times a day (TID) | ORAL | Status: DC

## 2012-03-09 MED ORDER — OXYCODONE HCL 5 MG PO CAPS: 5.0000 mg | ORAL_CAPSULE | Freq: Four times a day (QID) | ORAL | Status: DC

## 2012-03-09 NOTE — Progress Notes (Signed)
  Subjective:    Patient ID: Shirley Hamilton, female    DOB: June 28, 1936, 76 y.o.   MRN: [id number]  HPI Despite packing for a move patient has had stable pain scores. Husband is in a retirement facility. She plans to join him after she sells the house. Financial difficulties noted.  Pain Inventory Average Pain 8 Pain Right Now 8 My pain is constant and aching  In the last 24 hours, has pain interfered with the following? General activity 7 Relation with others 4 Enjoyment of life 4 What TIME of day is your pain at its worst? Daytime, Evening and Night Sleep (in general) Fair  Pain is worse with: bending and standing Pain improves with: rest and medication Relief from Meds: 5  Mobility walk without assistance ability to climb steps?  yes do you drive?  yes  Function retired I need assistance with the following:  household duties  Neuro/Psych tingling spasms dizziness anxiety  Prior Studies Any changes since last visit?  no  Physicians involved in your care Any changes since last visit?  no  Review of Systems  Constitutional: Negative.   HENT: Negative.   Eyes: Negative.   Respiratory: Negative.   Cardiovascular: Negative.   Gastrointestinal: Negative.   Genitourinary: Negative.   Musculoskeletal: Negative.   Skin: Negative.   Neurological: Negative.   Hematological: Negative.   Psychiatric/Behavioral:       Anxiety       Objective:   Physical Exam  Constitutional: She is oriented to person, place, and time. She appears well-developed and well-nourished.  Neck: Normal range of motion.  Musculoskeletal:       Lumbar back: Normal.  Neurological: She is alert and oriented to person, place, and time. She has normal strength. Gait normal.  Skin: Skin is warm and dry.  Psychiatric: Her speech is normal. Judgment and thought content normal. Her mood appears anxious. Her affect is labile. Cognition and memory are normal.       Cried when talking about her  family difficulties She is inattentive.          Assessment & Plan:  1. Lumbar postlaminectomy syndrome and chronic right L3 radiculitis we'll continue current medications this includes oxycodone 5 mg 4 times per day, Neurontin 300 mg 2 tablets 3 times per day although the patient states she takes is more or less twice a day Diazepam 5 mg twice a day for spasms although she does not wish take this regularly We discussed cyclobenzaprine but with her other medications and her age I be concerned this may put her at excess fall risk.

## 2012-03-09 NOTE — Patient Instructions (Signed)
Cont current meds.

## 2012-03-12 ENCOUNTER — Ambulatory Visit: Payer: Medicare Other | Admitting: Physical Medicine & Rehabilitation

## 2012-04-09 ENCOUNTER — Ambulatory Visit: Payer: Medicare Other | Admitting: Physical Medicine & Rehabilitation

## 2012-04-09 ENCOUNTER — Encounter: Payer: Medicare Other | Attending: Physical Medicine & Rehabilitation

## 2012-04-09 DIAGNOSIS — Z981 Arthrodesis status: Secondary | ICD-10-CM | POA: Insufficient documentation

## 2012-04-09 DIAGNOSIS — M47817 Spondylosis without myelopathy or radiculopathy, lumbosacral region: Secondary | ICD-10-CM | POA: Insufficient documentation

## 2012-04-12 ENCOUNTER — Ambulatory Visit (HOSPITAL_BASED_OUTPATIENT_CLINIC_OR_DEPARTMENT_OTHER): Payer: Medicare Other | Admitting: Physical Medicine & Rehabilitation

## 2012-04-12 ENCOUNTER — Encounter: Payer: Self-pay | Admitting: Physical Medicine & Rehabilitation

## 2012-04-12 VITALS — BP 126/49 | HR 75 | Resp 16 | Ht 64.0 in | Wt 135.0 lb

## 2012-04-12 DIAGNOSIS — M961 Postlaminectomy syndrome, not elsewhere classified: Secondary | ICD-10-CM

## 2012-04-12 MED ORDER — OXYCODONE HCL 5 MG PO CAPS: 5.0000 mg | ORAL_CAPSULE | Freq: Four times a day (QID) | ORAL | Status: DC

## 2012-04-12 MED ORDER — DIAZEPAM 5 MG PO TABS: 5.0000 mg | ORAL_TABLET | Freq: Two times a day (BID) | ORAL | Status: DC

## 2012-04-12 NOTE — Patient Instructions (Signed)
I recommend no alcohol in combination with oxycodone or Valium

## 2012-04-12 NOTE — Progress Notes (Signed)
  Subjective:    Patient ID: Shirley Hamilton, female    DOB: 10-01-1936, 76 y.o.   MRN: [id number]  HPI No new medical issues,  Pain Inventory Average Pain 8 Pain Right Now 8 My pain is constant  In the last 24 hours, has pain interfered with the following? General activity 2 Relation with others 3 Enjoyment of life 4 What TIME of day is your pain at its worst? morning Sleep (in general) Fair  Pain is worse with: standing Pain improves with: medication Relief from Meds: 8  Mobility walk without assistance  Function retired  Neuro/Psych No problems in this area  Prior Studies Any changes since last visit?  no  Physicians involved in your care Any changes since last visit?  no   History reviewed. No pertinent family history. History   Social History  . Marital Status: Married    Spouse Name: N/A    Number of Children: N/A  . Years of Education: N/A   Social History Main Topics  . Smoking status: Never Smoker   . Smokeless tobacco: Never Used  . Alcohol Use: None  . Drug Use: None  . Sexually Active: None   Other Topics Concern  . None   Social History Narrative  . None   Past Surgical History  Procedure Date  . Spine surgery   . Knee surgery     right   Past Medical History  Diagnosis Date  . Neuromuscular disorder    BP 126/49  Pulse 75  Resp 16  Ht 5\' 4"  (1.626 m)  Wt 135 lb (61.236 kg)  BMI 23.17 kg/m2  SpO2 97%      Review of Systems  Constitutional: Negative.   HENT: Negative.   Eyes: Negative.   Respiratory: Negative.   Cardiovascular: Negative.   Gastrointestinal: Negative.   Genitourinary: Negative.   Musculoskeletal: Positive for back pain.  Skin: Negative.   Neurological: Negative.   Hematological: Negative.        Objective:   Physical Exam        Assessment & Plan:

## 2012-05-11 ENCOUNTER — Ambulatory Visit (HOSPITAL_BASED_OUTPATIENT_CLINIC_OR_DEPARTMENT_OTHER): Payer: Medicare Other | Admitting: Physical Medicine & Rehabilitation

## 2012-05-11 ENCOUNTER — Encounter: Payer: Medicare Other | Attending: Physical Medicine & Rehabilitation

## 2012-05-11 ENCOUNTER — Encounter: Payer: Self-pay | Admitting: Physical Medicine & Rehabilitation

## 2012-05-11 ENCOUNTER — Other Ambulatory Visit: Payer: Self-pay | Admitting: *Deleted

## 2012-05-11 VITALS — BP 128/75 | HR 72 | Resp 14 | Ht 64.0 in | Wt 131.0 lb

## 2012-05-11 DIAGNOSIS — M961 Postlaminectomy syndrome, not elsewhere classified: Secondary | ICD-10-CM

## 2012-05-11 DIAGNOSIS — M47817 Spondylosis without myelopathy or radiculopathy, lumbosacral region: Secondary | ICD-10-CM | POA: Insufficient documentation

## 2012-05-11 DIAGNOSIS — Z981 Arthrodesis status: Secondary | ICD-10-CM | POA: Insufficient documentation

## 2012-05-11 MED ORDER — CYCLOBENZAPRINE HCL 5 MG PO TABS: 5.0000 mg | ORAL_TABLET | Freq: Three times a day (TID) | ORAL | Status: DC

## 2012-05-11 MED ORDER — OXYCODONE HCL 5 MG PO CAPS: 5.0000 mg | ORAL_CAPSULE | Freq: Four times a day (QID) | ORAL | Status: DC

## 2012-05-11 MED ORDER — CELECOXIB 100 MG PO CAPS: 100.0000 mg | ORAL_CAPSULE | Freq: Two times a day (BID) | ORAL | Status: AC

## 2012-05-11 NOTE — Patient Instructions (Signed)
Schedule ESI when Leg pain returns

## 2012-05-11 NOTE — Progress Notes (Signed)
  Subjective:    Patient ID: Shirley Hamilton, female    DOB: 1936/09/13, 76 y.o.   MRN: [id number]  HPI  Back pain overall stable  Pain Inventory Average Pain 9 Pain Right Now 8 My pain is dull and aching  In the last 24 hours, has pain interfered with the following? General activity 9 Relation with others 8 Enjoyment of life 8 What TIME of day is your pain at its worst? Morning and Evening Sleep (in general) Poor  Pain is worse with: walking, bending, sitting, standing and some activites Pain improves with: rest, pacing activities, medication and injections Relief from Meds: 5  Mobility walk without assistance how many minutes can you walk? 15-30 ability to climb steps?  yes do you drive?  yes  Function retired I need assistance with the following:  household duties  Neuro/Psych numbness anxiety  Prior Studies Any changes since last visit?  no  Physicians involved in your care Any changes since last visit?  no   History reviewed. No pertinent family history. History   Social History  . Marital Status: Married    Spouse Name: N/A    Number of Children: N/A  . Years of Education: N/A   Social History Main Topics  . Smoking status: Never Smoker   . Smokeless tobacco: Never Used  . Alcohol Use: None  . Drug Use: None  . Sexually Active: None   Other Topics Concern  . None   Social History Narrative  . None   Past Surgical History  Procedure Date  . Spine surgery   . Knee surgery     right   Past Medical History  Diagnosis Date  . Neuromuscular disorder     Review of Systems  Constitutional: Positive for appetite change and unexpected weight change.  HENT: Negative.   Eyes: Negative.   Respiratory: Negative.   Cardiovascular: Negative.   Gastrointestinal: Negative.   Genitourinary: Negative.   Musculoskeletal: Positive for back pain.  Skin: Negative.   Neurological: Positive for numbness.  Hematological: Negative.     Psychiatric/Behavioral: Negative.        Anxiety       Objective:   Physical Exam  Constitutional: She is oriented to person, place, and time. She appears well-developed and well-nourished.  Musculoskeletal:       Lumbar back: She exhibits normal range of motion and no tenderness.       - SLR  Neurological: She is alert and oriented to person, place, and time. She has normal strength. No sensory deficit. Gait normal.  Psychiatric: She has a normal mood and affect.          Assessment & Plan:  1.  Lumbar post lami syndrome. No radiculopathy currently May need another ESI  RTC 1 monthline continue oxycodone 5 mg 4 times per day Continue Valium 5 mg on a when necessary basis for muscle spasm she takes this less than once a day Have written for Celebrex 100 mg twice a day when necessary basis cautioned her in terms of long-term use. She is currently moving and has been very active

## 2012-06-14 ENCOUNTER — Encounter: Payer: Medicare Other | Attending: Physical Medicine & Rehabilitation

## 2012-06-14 ENCOUNTER — Encounter: Payer: Self-pay | Admitting: Physical Medicine & Rehabilitation

## 2012-06-14 ENCOUNTER — Ambulatory Visit (HOSPITAL_BASED_OUTPATIENT_CLINIC_OR_DEPARTMENT_OTHER): Payer: Medicare Other | Admitting: Physical Medicine & Rehabilitation

## 2012-06-14 VITALS — BP 129/69 | HR 71 | Resp 16 | Ht 64.0 in | Wt 132.0 lb

## 2012-06-14 DIAGNOSIS — M47817 Spondylosis without myelopathy or radiculopathy, lumbosacral region: Secondary | ICD-10-CM | POA: Insufficient documentation

## 2012-06-14 DIAGNOSIS — Z981 Arthrodesis status: Secondary | ICD-10-CM | POA: Insufficient documentation

## 2012-06-14 DIAGNOSIS — M961 Postlaminectomy syndrome, not elsewhere classified: Secondary | ICD-10-CM

## 2012-06-14 DIAGNOSIS — IMO0002 Reserved for concepts with insufficient information to code with codable children: Secondary | ICD-10-CM

## 2012-06-14 MED ORDER — DIAZEPAM 5 MG PO TABS: 5.0000 mg | ORAL_TABLET | Freq: Two times a day (BID) | ORAL | Status: DC

## 2012-06-14 MED ORDER — OXYCODONE HCL 5 MG PO CAPS: 5.0000 mg | ORAL_CAPSULE | Freq: Four times a day (QID) | ORAL | Status: DC

## 2012-06-14 MED ORDER — GABAPENTIN 300 MG PO CAPS: 600.0000 mg | ORAL_CAPSULE | Freq: Three times a day (TID) | ORAL | Status: DC

## 2012-06-14 NOTE — Progress Notes (Signed)
  Subjective:    Patient ID: Shirley Hamilton, female    DOB: 1935/12/20, 76 y.o.   MRN: [id number]  HPI No new medical issues. Any social issues related to moving to a retirement facility and trying to sell her home. Pain Inventory Average Pain 8 Pain Right Now 8 My pain is dull, stabbing and aching  In the last 24 hours, has pain interfered with the following? General activity 7 Relation with others 7 Enjoyment of life 9 What TIME of day is your pain at its worst? Morning and Evening Sleep (in general) Poor  Pain is worse with: sitting, standing and lifting Pain improves with: rest and medication Relief from Meds: 7  Mobility walk without assistance how many minutes can you walk? 15-30 ability to climb steps?  yes do you drive?  yes  Function retired  Neuro/Psych spasms depression anxiety  Prior Studies Any changes since last visit?  no  Physicians involved in your care Any changes since last visit?  no   History reviewed. No pertinent family history. History   Social History  . Marital Status: Married    Spouse Name: N/A    Number of Children: N/A  . Years of Education: N/A   Social History Main Topics  . Smoking status: Never Smoker   . Smokeless tobacco: Never Used  . Alcohol Use: None  . Drug Use: None  . Sexually Active: None   Other Topics Concern  . None   Social History Narrative  . None   Past Surgical History  Procedure Date  . Spine surgery   . Knee surgery     right   Past Medical History  Diagnosis Date  . Neuromuscular disorder    There were no vitals taken for this visit.      Review of Systems  Constitutional: Negative.   HENT: Negative.   Eyes: Negative.   Respiratory: Negative.   Cardiovascular: Negative.   Gastrointestinal: Negative.   Genitourinary: Negative.   Musculoskeletal: Positive for back pain.  Skin: Negative.   Psychiatric/Behavioral:       Depression/Anxiety       Objective:   Physical Exam    Constitutional: She is oriented to person, place, and time. She appears well-developed and well-nourished.  Musculoskeletal:       Lumbar back: She exhibits normal range of motion.       -SLR   Neurological: She is alert and oriented to person, place, and time.  Psychiatric: Her mood appears anxious. She exhibits a depressed mood.          Assessment & Plan:  1. Lumbar post lami syndrome.  No radiculopathy currently  May need another ESI  RTC 1 month PA visit continue oxycodone 5 mg 4 times per day  Continue Valium 5 mg on a when necessary basis for muscle spasm she takes this less than once a day  Have written for Celebrex 100 mg twice a day when necessary basis cautioned her in terms of long-term use. She is currently moving and has been very active

## 2012-06-14 NOTE — Patient Instructions (Signed)
Continue your current medications as prescribed No alcohol with these medicines You'll see my PA Clydie Braun next month

## 2012-06-29 ENCOUNTER — Encounter: Payer: Self-pay | Admitting: Physical Medicine and Rehabilitation

## 2012-07-13 ENCOUNTER — Ambulatory Visit (HOSPITAL_BASED_OUTPATIENT_CLINIC_OR_DEPARTMENT_OTHER): Payer: Medicare Other | Admitting: Physical Medicine & Rehabilitation

## 2012-07-13 ENCOUNTER — Encounter: Payer: Self-pay | Admitting: Physical Medicine & Rehabilitation

## 2012-07-13 ENCOUNTER — Encounter: Payer: Medicare Other | Attending: Physical Medicine & Rehabilitation

## 2012-07-13 VITALS — BP 131/58 | Resp 14 | Ht 64.0 in | Wt 133.0 lb

## 2012-07-13 DIAGNOSIS — IMO0002 Reserved for concepts with insufficient information to code with codable children: Secondary | ICD-10-CM

## 2012-07-13 DIAGNOSIS — Z981 Arthrodesis status: Secondary | ICD-10-CM | POA: Insufficient documentation

## 2012-07-13 DIAGNOSIS — M47817 Spondylosis without myelopathy or radiculopathy, lumbosacral region: Secondary | ICD-10-CM | POA: Insufficient documentation

## 2012-07-13 DIAGNOSIS — M961 Postlaminectomy syndrome, not elsewhere classified: Secondary | ICD-10-CM

## 2012-07-13 MED ORDER — DIAZEPAM 5 MG PO TABS: 5.0000 mg | ORAL_TABLET | Freq: Two times a day (BID) | ORAL | Status: DC

## 2012-07-13 MED ORDER — OXYCODONE HCL 5 MG PO CAPS: 5.0000 mg | ORAL_CAPSULE | Freq: Four times a day (QID) | ORAL | Status: DC

## 2012-07-13 NOTE — Patient Instructions (Signed)
No alcohol with pain meds 

## 2012-07-13 NOTE — Progress Notes (Signed)
  Subjective:    Patient ID: Shirley Hamilton, female    DOB: Mar 09, 1936, 76 y.o.   MRN: [id number]  HPI R thigh pain recurrent, still packing up house  +EtOH in urine discussed clinic policy no Etoh if on benzo and opiates Pain Inventory Average Pain 8 Pain Right Now 8 My pain is dull and tingling  In the last 24 hours, has pain interfered with the following? General activity 7 Relation with others 5 Enjoyment of life 5 What TIME of day is your pain at its worst? morning and evening Sleep (in general) Poor  Pain is worse with: sitting and standing Pain improves with: rest and medication Relief from Meds: 8  Mobility walk without assistance how many minutes can you walk? 15-30 ability to climb steps?  yes do you drive?  yes  Function retired  Neuro/Psych weakness anxiety  Prior Studies Any changes since last visit?  no  Physicians involved in your care Any changes since last visit?  no   History reviewed. No pertinent family history. History   Social History  . Marital Status: Married    Spouse Name: N/A    Number of Children: N/A  . Years of Education: N/A   Social History Main Topics  . Smoking status: Never Smoker   . Smokeless tobacco: Never Used  . Alcohol Use: None  . Drug Use: None  . Sexually Active: None   Other Topics Concern  . None   Social History Narrative  . None   Past Surgical History  Procedure Date  . Spine surgery   . Knee surgery     right   Past Medical History  Diagnosis Date  . Neuromuscular disorder    BP 131/58  Resp 14  Ht 5\' 4"  (1.626 m)  Wt 133 lb (60.328 kg)  BMI 22.83 kg/m2     Review of Systems  Musculoskeletal: Positive for myalgias, back pain, arthralgias and gait problem.  Neurological: Positive for weakness.  Psychiatric/Behavioral: The patient is nervous/anxious.   All other systems reviewed and are negative.       Objective:   Physical Exam  Constitutional: She is oriented to person,  place, and time. She appears well-developed and well-nourished.  HENT:  Head: Normocephalic and atraumatic.  Eyes: Conjunctivae normal and EOM are normal. Pupils are equal, round, and reactive to light.  Neurological: She is alert and oriented to person, place, and time. She has normal strength. A sensory deficit is present.  Psychiatric: She has a normal mood and affect.          Assessment & Plan:  1. Lumbar post lami syndrome.  R L3-4 ESI done in March,2013  RTC 1 month PA visit continue oxycodone 5 mg 4 times per day  Continue Valium 5 mg on a when necessary basis for muscle spasm she takes this less than once a day  Have written for Celebrex 100 mg twice a day when necessary basis cautioned her in terms of long-term use. She is currently moving and has been very active

## 2012-07-14 ENCOUNTER — Encounter: Payer: Medicare Other | Admitting: Physical Medicine and Rehabilitation

## 2012-07-19 ENCOUNTER — Other Ambulatory Visit: Payer: Self-pay | Admitting: *Deleted

## 2012-07-19 MED ORDER — CYCLOBENZAPRINE HCL 5 MG PO TABS: 5.0000 mg | ORAL_TABLET | Freq: Three times a day (TID) | ORAL | Status: DC

## 2012-08-02 ENCOUNTER — Other Ambulatory Visit: Payer: Self-pay | Admitting: *Deleted

## 2012-08-03 ENCOUNTER — Other Ambulatory Visit: Payer: Self-pay | Admitting: *Deleted

## 2012-08-03 MED ORDER — GABAPENTIN 600 MG PO TABS: 600.0000 mg | ORAL_TABLET | Freq: Three times a day (TID) | ORAL | Status: DC

## 2012-08-10 ENCOUNTER — Encounter: Payer: Self-pay | Admitting: Physical Medicine and Rehabilitation

## 2012-08-10 ENCOUNTER — Encounter
Payer: Medicare Other | Attending: Physical Medicine and Rehabilitation | Admitting: Physical Medicine and Rehabilitation

## 2012-08-10 VITALS — BP 132/70 | HR 75 | Resp 14 | Ht 64.0 in | Wt 130.0 lb

## 2012-08-10 DIAGNOSIS — M545 Low back pain, unspecified: Secondary | ICD-10-CM | POA: Insufficient documentation

## 2012-08-10 DIAGNOSIS — M961 Postlaminectomy syndrome, not elsewhere classified: Secondary | ICD-10-CM | POA: Insufficient documentation

## 2012-08-10 MED ORDER — DIAZEPAM 5 MG PO TABS: 5.0000 mg | ORAL_TABLET | Freq: Two times a day (BID) | ORAL | Status: DC

## 2012-08-10 MED ORDER — OXYCODONE HCL 5 MG PO CAPS: 5.0000 mg | ORAL_CAPSULE | Freq: Four times a day (QID) | ORAL | Status: DC

## 2012-08-10 NOTE — Patient Instructions (Signed)
Stay as active as tolerated, try to walk regularly.

## 2012-08-10 NOTE — Progress Notes (Signed)
Subjective:    Patient ID: Shirley Hamilton, female    DOB: 1936/06/17, 76 y.o.   MRN: [id number]  HPI The patient is a 76 year old female , who presents with chronic LBP . The symptoms started around 10 years ago. The patient complains about moderate to severe pain , which radiate to the right thigh, and in the right posterior hip, which started after her last epidural. Patient denies numbness and tingling  .  She describes the pain as aching . Applying heat, taking medications , changing positions alleviate the symptoms. Prolonged standing aggrevates the symptoms. The patient grades his pain as a 9 /10. Hx of PSF in L-spine. Pain Inventory Average Pain 8 Pain Right Now 9 My pain is dull and aching  In the last 24 hours, has pain interfered with the following? General activity 7 Relation with others 7 Enjoyment of life 7 What TIME of day is your pain at its worst? daytime Sleep (in general) Fair  Pain is worse with: walking, sitting and standing Pain improves with: rest and medication Relief from Meds: 5  Mobility walk without assistance how many minutes can you walk? 15 ability to climb steps?  yes do you drive?  yes transfers alone Do you have any goals in this area?  yes  Function retired  Neuro/Psych trouble walking spasms depression anxiety  Prior Studies Any changes since last visit?  no  Physicians involved in your care Any changes since last visit?  no   History reviewed. No pertinent family history. History   Social History  . Marital Status: Married    Spouse Name: N/A    Number of Children: N/A  . Years of Education: N/A   Social History Main Topics  . Smoking status: Never Smoker   . Smokeless tobacco: Never Used  . Alcohol Use: None  . Drug Use: None  . Sexually Active: None   Other Topics Concern  . None   Social History Narrative  . None   Past Surgical History  Procedure Date  . Spine surgery   . Knee surgery     right   Past  Medical History  Diagnosis Date  . Neuromuscular disorder    BP 132/70  Pulse 75  Resp 14  Ht 5\' 4"  (1.626 m)  Wt 130 lb (58.968 kg)  BMI 22.31 kg/m2  SpO2 99%     Review of Systems  Constitutional: Positive for unexpected weight change.  Musculoskeletal: Positive for myalgias, back pain, arthralgias and gait problem.  Psychiatric/Behavioral: Positive for dysphoric mood. The patient is nervous/anxious.   All other systems reviewed and are negative.       Objective:   Physical Exam  Constitutional: She is oriented to person, place, and time. She appears well-developed and well-nourished.  HENT:  Head: Normocephalic.  Neck: Neck supple.  Musculoskeletal: She exhibits tenderness.  Neurological: She is alert and oriented to person, place, and time.  Skin: Skin is warm and dry.  Psychiatric: She has a normal mood and affect.     Symmetric normal motor tone is noted throughout. Normal muscle bulk. Muscle testing reveals 5/5 muscle strength of the upper extremity, and 5/5 of the lower extremity. Full range of motion in upper and lower extremities. ROM of spine is not restricted. Fine motor movements are normal in both hands. Sensory is intact and symmetric to light touch, pinprick and proprioception. DTR in the upper and lower extremity are present and symmetric 1+, trace at right patella  reflex. No clonus is noted.  Patient arises from chair without difficulty. Narrow based gait with normal arm swing bilateral , able to walk on heels and toes . Tandem walk is stable. No pronator drift. Rhomberg negative. Good finger to nose and heel to shin testing. No tremor, dystaxia or dysmetria noted.       Assessment & Plan:  1. Lumbar post lami syndrome.Hx  of Lumbar PSF R L3-4 ESI done in March,2013  RTC 1 month PA visit continue oxycodone 5 mg 4 times per day  Continue Valium 5 mg on a when necessary basis for muscle spasm she takes this less than once a day  Have written for  Celebrex 100 mg twice a day when necessary basis cautioned her in terms of long-term use. She is currently moving and has been very active, which has aggrevated her pain some.

## 2012-09-03 ENCOUNTER — Other Ambulatory Visit: Payer: Self-pay

## 2012-09-03 NOTE — Telephone Encounter (Signed)
Patient wanted to let us know that cyclobenzaprine will only be available in 10mg  tablets.

## 2012-09-07 ENCOUNTER — Encounter
Payer: Medicare Other | Attending: Physical Medicine and Rehabilitation | Admitting: Physical Medicine and Rehabilitation

## 2012-09-07 ENCOUNTER — Encounter: Payer: Self-pay | Admitting: Physical Medicine and Rehabilitation

## 2012-09-07 VITALS — BP 105/65 | HR 68 | Resp 14 | Ht 64.0 in | Wt 134.0 lb

## 2012-09-07 DIAGNOSIS — M961 Postlaminectomy syndrome, not elsewhere classified: Secondary | ICD-10-CM

## 2012-09-07 DIAGNOSIS — M545 Low back pain, unspecified: Secondary | ICD-10-CM

## 2012-09-07 DIAGNOSIS — G8929 Other chronic pain: Secondary | ICD-10-CM | POA: Insufficient documentation

## 2012-09-07 DIAGNOSIS — M79609 Pain in unspecified limb: Secondary | ICD-10-CM | POA: Insufficient documentation

## 2012-09-07 DIAGNOSIS — M25559 Pain in unspecified hip: Secondary | ICD-10-CM | POA: Insufficient documentation

## 2012-09-07 MED ORDER — DIAZEPAM 5 MG PO TABS: 5.0000 mg | ORAL_TABLET | Freq: Two times a day (BID) | ORAL | Status: DC

## 2012-09-07 MED ORDER — CYCLOBENZAPRINE HCL 5 MG PO TABS: 5.0000 mg | ORAL_TABLET | Freq: Three times a day (TID) | ORAL | Status: DC

## 2012-09-07 MED ORDER — OXYCODONE HCL 5 MG PO CAPS: 5.0000 mg | ORAL_CAPSULE | Freq: Four times a day (QID) | ORAL | Status: DC

## 2012-09-07 NOTE — Progress Notes (Signed)
Subjective:    Patient ID: Shirley Hamilton, female    DOB: Aug 28, 1936, 76 y.o.   MRN: [id number]  HPI The patient is a 76 year old female , who presents with chronic LBP . The symptoms started around 10 years ago. The patient complains about moderate to severe pain , which radiate to the right thigh, and in the right posterior hip, which started after her last epidural. Patient denies numbness and tingling . She describes the pain as aching . Applying heat, taking medications , changing positions alleviate the symptoms. Prolonged standing aggrevates the symptoms. The patient grades her pain as a 8 /10. Hx of PSF in L-spine.  Pain Inventory Average Pain 8 Pain Right Now 8 My pain is dull, aching and throbbing  In the last 24 hours, has pain interfered with the following? General activity 9 Relation with others 9 Enjoyment of life 9 What TIME of day is your pain at its worst? morning and evening Sleep (in general) Fair  Pain is worse with: sitting, inactivity and standing Pain improves with: rest and medication Relief from Meds: 4  Mobility walk without assistance how many minutes can you walk? 20 ability to climb steps?  yes do you drive?  yes transfers alone Do you have any goals in this area?  yes  Function retired  Neuro/Psych spasms anxiety  Prior Studies Any changes since last visit?  no  Physicians involved in your care Any changes since last visit?  no   History reviewed. No pertinent family history. History   Social History  . Marital Status: Married    Spouse Name: N/A    Number of Children: N/A  . Years of Education: N/A   Social History Main Topics  . Smoking status: Never Smoker   . Smokeless tobacco: Never Used  . Alcohol Use: None  . Drug Use: None  . Sexually Active: None   Other Topics Concern  . None   Social History Narrative  . None   Past Surgical History  Procedure Date  . Spine surgery   . Knee surgery     right   Past  Medical History  Diagnosis Date  . Neuromuscular disorder    BP 105/65  Pulse 68  Resp 14  Ht 5\' 4"  (1.626 m)  Wt 134 lb (60.782 kg)  BMI 23.00 kg/m2  SpO2 96%     Review of Systems  Constitutional: Positive for unexpected weight change.  HENT: Positive for neck pain.   Musculoskeletal: Positive for back pain.  Psychiatric/Behavioral: The patient is nervous/anxious.   All other systems reviewed and are negative.       Objective:   Physical Exam Constitutional: She is oriented to person, place, and time. She appears well-developed and well-nourished.  HENT:  Head: Normocephalic.  Neck: Neck supple.  Musculoskeletal: She exhibits tenderness.  Neurological: She is alert and oriented to person, place, and time.  Skin: Skin is warm and dry.  Psychiatric: She has a normal mood and affect.   Symmetric normal motor tone is noted throughout. Normal muscle bulk. Muscle testing reveals 5/5 muscle strength of the upper extremity, and 5/5 of the lower extremity. Full range of motion in upper and lower extremities. ROM of spine is not restricted. Fine motor movements are normal in both hands.  Sensory is intact and symmetric to light touch, pinprick and proprioception.  DTR in the upper and lower extremity are present and symmetric 1+, trace at right patella reflex. No clonus is  noted.  Patient arises from chair without difficulty. Narrow based gait with normal arm swing bilateral , able to walk on heels and toes . Tandem walk is stable. No pronator drift. Rhomberg negative.  Good finger to nose and heel to shin testing. No tremor, dystaxia or dysmetria noted.         Assessment & Plan:  1. Lumbar post lami syndrome.Hx of Lumbar PSF  R L3-4 ESI done in March,2013  RTC 1 month PA visit continue oxycodone 5 mg 4 times per day  Continue Valium 5 mg on a when necessary basis for muscle spasm she takes this less than once a day  Have written for Celebrex 100 mg twice a day when  necessary basis cautioned her in terms of long-term use. She is currently moving and has been very active, which has aggrevated her pain some.

## 2012-09-07 NOTE — Patient Instructions (Signed)
Try to be as active as tolerated, do not overdo it when you have to move on the week .

## 2012-10-15 ENCOUNTER — Telehealth: Payer: Self-pay | Admitting: *Deleted

## 2012-10-15 ENCOUNTER — Telehealth: Payer: Self-pay

## 2012-10-15 MED ORDER — OXYCODONE HCL 5 MG PO CAPS: 5.0000 mg | ORAL_CAPSULE | Freq: Four times a day (QID) | ORAL | Status: DC

## 2012-10-15 MED ORDER — DIAZEPAM 5 MG PO TABS: 5.0000 mg | ORAL_TABLET | Freq: Two times a day (BID) | ORAL | Status: DC

## 2012-10-15 NOTE — Telephone Encounter (Signed)
Left message advising patient script is ready for pick up. 

## 2012-10-15 NOTE — Telephone Encounter (Signed)
Needs refill on Oxycodone. Printed for Kirsteins to sign.

## 2012-10-15 NOTE — Telephone Encounter (Signed)
[facility]

## 2012-10-18 ENCOUNTER — Encounter
Payer: Medicare Other | Attending: Physical Medicine and Rehabilitation | Admitting: Physical Medicine and Rehabilitation

## 2012-10-18 ENCOUNTER — Encounter: Payer: Self-pay | Admitting: Physical Medicine and Rehabilitation

## 2012-10-18 VITALS — BP 133/62 | HR 85 | Resp 14 | Ht 64.0 in | Wt 144.0 lb

## 2012-10-18 DIAGNOSIS — Z981 Arthrodesis status: Secondary | ICD-10-CM | POA: Insufficient documentation

## 2012-10-18 DIAGNOSIS — M545 Low back pain, unspecified: Secondary | ICD-10-CM | POA: Insufficient documentation

## 2012-10-18 DIAGNOSIS — M961 Postlaminectomy syndrome, not elsewhere classified: Secondary | ICD-10-CM | POA: Insufficient documentation

## 2012-10-18 NOTE — Progress Notes (Signed)
Subjective:    Patient ID: Shirley Hamilton, female    DOB: [birth year], 76 y.o.   MRN: [id number]  HPI The patient is a 76 year old female , who presents with chronic LBP . The symptoms started around 10 years ago. The patient complains about increasing moderate to severe pain , which radiate to the right thigh, and in the right posterior hip, which started after her last epidural. Patient also complains about numbness in the right anterior thigh.She describes the pain as aching . Applying heat, taking medications , changing positions alleviate the symptoms. Prolonged standing aggrevates the symptoms. The patient grades her pain as a 7 /10. Hx of PSF in L-spine.  Pain Inventory Average Pain 8 Pain Right Now 7 My pain is sharp, dull and aching  In the last 24 hours, has pain interfered with the following? General activity 7 Relation with others 7 Enjoyment of life 7 What TIME of day is your pain at its worst? morning and daytime Sleep (in general) Fair  Pain is worse with: sitting, standing and some activites Pain improves with: pacing activities and medication Relief from Meds: 6  Mobility walk without assistance how many minutes can you walk? 15 ability to climb steps?  yes do you drive?  yes transfers alone Do you have any goals in this area?  yes  Function retired I need assistance with the following:  household duties and shopping Do you have any goals in this area?  yes  Neuro/Psych tingling spasms depression anxiety loss of taste or smell  Prior Studies Any changes since last visit?  no  Physicians involved in your care Any changes since last visit?  no   History reviewed. No pertinent family history. History   Social History  . Marital Status: Married    Spouse Name: N/A    Number of Children: N/A  . Years of Education: N/A   Social History Main Topics  . Smoking status: Never Smoker   . Smokeless tobacco: Never Used  . Alcohol Use: None  . Drug  Use: None  . Sexually Active: None   Other Topics Concern  . None   Social History Narrative  . None   Past Surgical History  Procedure Date  . Spine surgery   . Knee surgery     right   Past Medical History  Diagnosis Date  . Neuromuscular disorder    BP 133/62  Pulse 85  Resp 14  Ht 5\' 4"  (1.626 m)  Wt 144 lb (65.318 kg)  BMI 24.72 kg/m2  SpO2 97%    Review of Systems  Musculoskeletal: Positive for back pain.  Psychiatric/Behavioral: Positive for dysphoric mood. The patient is nervous/anxious.   All other systems reviewed and are negative.       Objective:   Physical Exam Constitutional: She is oriented to person, place, and time. She appears well-developed and well-nourished.  HENT:  Head: Normocephalic.  Neck: Neck supple.  Musculoskeletal: She exhibits tenderness.  Neurological: She is alert and oriented to person, place, and time.  Skin: Skin is warm and dry.  Psychiatric: She has a normal mood and affect.  Symmetric normal motor tone is noted throughout. Normal muscle bulk. Muscle testing reveals 5/5 muscle strength of the upper extremity, and 5/5 of the lower extremity. Full range of motion in upper and lower extremities. ROM of spine is not restricted. Fine motor movements are normal in both hands.  Sensory is intact and symmetric to light touch, pinprick and  proprioception.  DTR in the upper and lower extremity are present and symmetric 1+, trace at right patella reflex. No clonus is noted.  Patient arises from chair without difficulty. Narrow based gait with normal arm swing bilateral , able to walk on heels and toes . Tandem walk is stable. No pronator drift. Rhomberg negative.  Good finger to nose and heel to shin testing. No tremor, dystaxia or dysmetria noted.         Assessment & Plan:  1. Lumbar post lami syndrome.Hx of Lumbar PSF L4-5, increasing pain with radiation into right anterior thigh, ordered MRI of L-spine, consider further  treatment after results. R L3-4 ESI done in March,2013  RTC 1 month PA visit continue oxycodone 5 mg 4 times per day  Continue Valium 5 mg on a when necessary basis for muscle spasm she takes this less than once a day  Have written for Celebrex 100 mg twice a day when necessary basis cautioned her in terms of long-term use. She is currently moving and has been very active, which has aggrevated her pain some.

## 2012-10-18 NOTE — Patient Instructions (Signed)
Try to stay as active as pain permits. 

## 2012-10-19 ENCOUNTER — Telehealth: Payer: Self-pay | Admitting: Physical Medicine and Rehabilitation

## 2012-10-19 NOTE — Telephone Encounter (Signed)
Shirley Hamilton requested MRI lumbar spine with contrast.  Insurance company is asking since patient has had numerous surgeries, if she could change that to MRI with and without contrast. Please let me know.

## 2012-10-21 NOTE — Addendum Note (Signed)
Addended by: Su Monks on: 10/21/2012 09:37 AM   Modules accepted: Orders

## 2012-10-21 NOTE — Telephone Encounter (Signed)
Ordered MRI of L-spine, w/wo

## 2012-10-29 ENCOUNTER — Telehealth: Payer: Self-pay | Admitting: *Deleted

## 2012-10-29 DIAGNOSIS — M545 Low back pain: Secondary | ICD-10-CM

## 2012-10-29 DIAGNOSIS — M961 Postlaminectomy syndrome, not elsewhere classified: Secondary | ICD-10-CM

## 2012-10-29 DIAGNOSIS — Z01818 Encounter for other preprocedural examination: Secondary | ICD-10-CM

## 2012-10-29 NOTE — Telephone Encounter (Signed)
Will need labs before MRI.  Order placed.

## 2012-11-01 ENCOUNTER — Other Ambulatory Visit: Payer: Self-pay | Admitting: Physical Medicine and Rehabilitation

## 2012-11-01 ENCOUNTER — Ambulatory Visit (HOSPITAL_COMMUNITY)
Admission: RE | Admit: 2012-11-01 | Discharge: 2012-11-01 | Disposition: A | Discharge status: Home / Self Care | Payer: Medicare Other | Source: Ambulatory Visit | Attending: Physical Medicine and Rehabilitation | Admitting: Physical Medicine and Rehabilitation

## 2012-11-01 DIAGNOSIS — M51379 Other intervertebral disc degeneration, lumbosacral region without mention of lumbar back pain or lower extremity pain: Secondary | ICD-10-CM | POA: Insufficient documentation

## 2012-11-01 DIAGNOSIS — M5137 Other intervertebral disc degeneration, lumbosacral region: Secondary | ICD-10-CM | POA: Insufficient documentation

## 2012-11-01 DIAGNOSIS — M961 Postlaminectomy syndrome, not elsewhere classified: Secondary | ICD-10-CM

## 2012-11-03 ENCOUNTER — Telehealth: Payer: Self-pay

## 2012-11-03 NOTE — Telephone Encounter (Signed)
Patient aware of results.  Copy of MRI mailed to her.

## 2012-11-03 NOTE — Telephone Encounter (Signed)
Message copied by Judd Gaudier on Wed Nov 03, 2012 10:11 AM ------      Message from: Su Monks      Created: Wed Nov 03, 2012  9:19 AM       Please call patient , MRI result : PSF L4-5 looks satisfactory. L2-3 worsened DDD, stenosis of lateral recess could cause neural compression,       L3-4 worsened DDD, plus 2mm anterolisthesis, multifactorial stenosis, likely to cause neural compression. Will explain in detail at her next visit.

## 2012-11-17 ENCOUNTER — Encounter: Payer: Self-pay | Admitting: Physical Medicine and Rehabilitation

## 2012-11-17 ENCOUNTER — Encounter
Payer: Medicare Other | Attending: Physical Medicine and Rehabilitation | Admitting: Physical Medicine and Rehabilitation

## 2012-11-17 VITALS — BP 133/56 | HR 71 | Resp 14 | Ht 64.0 in | Wt 149.0 lb

## 2012-11-17 DIAGNOSIS — Z981 Arthrodesis status: Secondary | ICD-10-CM | POA: Insufficient documentation

## 2012-11-17 DIAGNOSIS — M961 Postlaminectomy syndrome, not elsewhere classified: Secondary | ICD-10-CM

## 2012-11-17 MED ORDER — OXYCODONE HCL 5 MG PO CAPS: 5.0000 mg | ORAL_CAPSULE | Freq: Four times a day (QID) | ORAL | Status: DC

## 2012-11-17 MED ORDER — CYCLOBENZAPRINE HCL 5 MG PO TABS: 5.0000 mg | ORAL_TABLET | Freq: Three times a day (TID) | ORAL | Status: DC

## 2012-11-17 NOTE — Patient Instructions (Signed)
Continue with exercising and walking as pain permits.

## 2012-11-17 NOTE — Progress Notes (Signed)
Subjective:    Patient ID: Shirley Hamilton, female    DOB: Jan 08, 1936, 77 y.o.   MRN: [id number]  HPI The patient is a 77 year old female , who presents with chronic LBP . The symptoms started around 10 years ago. The patient complains about increasing moderate to severe pain , which radiate to the right thigh, and in the right posterior hip, which started after her last epidural. Patient also complains about numbness in the right anterior thigh.She describes the pain as aching . Applying heat, taking medications , changing positions alleviate the symptoms. Prolonged standing aggrevates the symptoms. The patient grades her pain as a 7 /10. Hx of PSF in L-spine. Patient wants to discuss her MRI.  Pain Inventory Average Pain 8 Pain Right Now 8 My pain is intermittent, constant, dull, tingling and aching  In the last 24 hours, has pain interfered with the following? General activity 5 Relation with others 5 Enjoyment of life 5 What TIME of day is your pain at its worst? all the time Sleep (in general) Fair  Pain is worse with: sitting and standing Pain improves with: rest and medication Relief from Meds: 7  Mobility walk without assistance how many minutes can you walk? 15 ability to climb steps?  yes do you drive?  yes transfers alone Do you have any goals in this area?  yes  Function retired I need assistance with the following:  household duties  Neuro/Psych numbness tingling spasms  Prior Studies Any changes since last visit?  no  Physicians involved in your care Any changes since last visit?  no   History reviewed. No pertinent family history. History   Social History  . Marital Status: Married    Spouse Name: N/A    Number of Children: N/A  . Years of Education: N/A   Social History Main Topics  . Smoking status: Never Smoker   . Smokeless tobacco: Never Used  . Alcohol Use: None  . Drug Use: None  . Sexually Active: None   Other Topics Concern  .  None   Social History Narrative  . None   Past Surgical History  Procedure Date  . Spine surgery   . Knee surgery     right   Past Medical History  Diagnosis Date  . Neuromuscular disorder    BP 133/56  Pulse 71  Resp 14  Ht 5\' 4"  (1.626 m)  Wt 149 lb (67.586 kg)  BMI 25.58 kg/m2  SpO2 96%     Review of Systems  Constitutional: Positive for unexpected weight change.  Musculoskeletal: Positive for back pain.  Neurological: Positive for numbness.  All other systems reviewed and are negative.       Objective:   Physical Exam Constitutional: She is oriented to person, place, and time. She appears well-developed and well-nourished.  HENT:  Head: Normocephalic.  Neck: Neck supple.  Musculoskeletal: She exhibits tenderness.  Neurological: She is alert and oriented to person, place, and time.  Skin: Skin is warm and dry.  Psychiatric: She has a normal mood and affect.  Symmetric normal motor tone is noted throughout. Normal muscle bulk. Muscle testing reveals 5/5 muscle strength of the upper extremity, and 5/5 of the lower extremity. Full range of motion in upper and lower extremities. ROM of spine is  restricted. Fine motor movements are normal in both hands.  Sensory is intact and symmetric to light touch, pinprick and proprioception.  DTR in the upper and lower extremity are present  and symmetric 1+, trace at right patella reflex. No clonus is noted.  Patient arises from chair without difficulty. Narrow based gait with normal arm swing bilateral , able to walk on heels and toes . Tandem walk is stable. No pronator drift. Rhomberg negative.  Good finger to nose and heel to shin testing. No tremor, dystaxia or dysmetria noted.        Assessment & Plan:  1. Lumbar post lami syndrome.Hx of Lumbar PSF L4-5, increasing pain with radiation into right anterior thigh, ordered MRI of L-spine, at last visit. MRI result :  PSF L4-5 looks satisfactory. L2-3 worsened DDD,  stenosis of lateral recess could cause neural compression,  L3-4 worsened DDD, plus 2mm anterolisthesis, multifactorial stenosis, likely to cause neural compression. Referral to neurosurgeon, Dr. Danielle Dess . Explained MRI in great length and detail, spent 25 min face to face with patient. R L3-4 ESI done in March,2013 , which did not help . RTC 1 month PA visit, continue oxycodone 5 mg 4 times per day  Continue Valium 5 mg on a when necessary basis for muscle spasm she takes this less than once a day  Advised patient to continue with her walking and exercise program.

## 2012-12-15 ENCOUNTER — Encounter: Payer: Medicare Other | Admitting: Physical Medicine and Rehabilitation

## 2012-12-16 ENCOUNTER — Encounter
Payer: Medicare Other | Attending: Physical Medicine and Rehabilitation | Admitting: Physical Medicine and Rehabilitation

## 2012-12-16 ENCOUNTER — Encounter: Payer: Self-pay | Admitting: Physical Medicine and Rehabilitation

## 2012-12-16 VITALS — BP 101/63 | HR 83 | Resp 14 | Ht 64.0 in | Wt 150.0 lb

## 2012-12-16 DIAGNOSIS — G8929 Other chronic pain: Secondary | ICD-10-CM | POA: Insufficient documentation

## 2012-12-16 DIAGNOSIS — M51379 Other intervertebral disc degeneration, lumbosacral region without mention of lumbar back pain or lower extremity pain: Secondary | ICD-10-CM | POA: Insufficient documentation

## 2012-12-16 DIAGNOSIS — M79609 Pain in unspecified limb: Secondary | ICD-10-CM | POA: Insufficient documentation

## 2012-12-16 DIAGNOSIS — M961 Postlaminectomy syndrome, not elsewhere classified: Secondary | ICD-10-CM | POA: Insufficient documentation

## 2012-12-16 DIAGNOSIS — M545 Low back pain, unspecified: Secondary | ICD-10-CM | POA: Insufficient documentation

## 2012-12-16 DIAGNOSIS — M25559 Pain in unspecified hip: Secondary | ICD-10-CM | POA: Insufficient documentation

## 2012-12-16 MED ORDER — DIAZEPAM 5 MG PO TABS: 5.0000 mg | ORAL_TABLET | Freq: Two times a day (BID) | ORAL | Status: DC

## 2012-12-16 MED ORDER — OXYCODONE HCL 5 MG PO CAPS: 5.0000 mg | ORAL_CAPSULE | Freq: Four times a day (QID) | ORAL | Status: DC

## 2012-12-16 NOTE — Progress Notes (Signed)
Subjective:    Patient ID: Shirley Hamilton, female    DOB: 12/14/1935, 77 y.o.   MRN: [id number]  HPI The patient is a 77 year old female , who presents with chronic LBP . The symptoms started around 10 years ago. The patient complains about increasing moderate to severe pain , which radiate to the right thigh, and in the right posterior hip, which started after her last epidural. Patient also complains about numbness in the right anterior thigh.She describes the pain as aching . Applying heat, taking medications , changing positions alleviate the symptoms. Prolonged standing aggrevates the symptoms. The patient grades her pain as a 7 /10. Hx of PSF in L-spine.   Pain Inventory Average Pain 8 Pain Right Now 7 My pain is sharp, stabbing, tingling and aching  In the last 24 hours, has pain interfered with the following? General activity 7 Relation with others 5 Enjoyment of life 5 What TIME of day is your pain at its worst? morning and evening Sleep (in general) Fair  Pain is worse with: sitting and standing Pain improves with: rest and medication Relief from Meds: 6  Mobility walk without assistance how many minutes can you walk? 15-25 ability to climb steps?  yes do you drive?  yes Do you have any goals in this area?  yes  Function retired I need assistance with the following:  household duties Do you have any goals in this area?  yes  Neuro/Psych spasms  Prior Studies Any changes since last visit?  no  Physicians involved in your care Any changes since last visit?  no   History reviewed. No pertinent family history. History   Social History  . Marital Status: Married    Spouse Name: N/A    Number of Children: N/A  . Years of Education: N/A   Social History Main Topics  . Smoking status: Never Smoker   . Smokeless tobacco: Never Used  . Alcohol Use: None  . Drug Use: None  . Sexually Active: None   Other Topics Concern  . None   Social History Narrative   . None   Past Surgical History  Procedure Laterality Date  . Spine surgery    . Knee surgery      right   Past Medical History  Diagnosis Date  . Neuromuscular disorder    BP 101/63  Pulse 83  Resp 14  Ht 5\' 4"  (1.626 m)  Wt 150 lb (68.04 kg)  BMI 25.73 kg/m2  SpO2 97%     Review of Systems  Constitutional: Positive for fever, chills and unexpected weight change.  Musculoskeletal: Positive for back pain.  All other systems reviewed and are negative.       Objective:   Physical Exam Constitutional: She is oriented to person, place, and time. She appears well-developed and well-nourished.  HENT:  Head: Normocephalic.  Neck: Neck supple.  Musculoskeletal: She exhibits tenderness.  Neurological: She is alert and oriented to person, place, and time.  Skin: Skin is warm and dry.  Psychiatric: She has a normal mood and affect.  Symmetric normal motor tone is noted throughout. Normal muscle bulk. Muscle testing reveals 5/5 muscle strength of the upper extremity, and 5/5 of the lower extremity. Full range of motion in upper and lower extremities. ROM of spine is restricted. Fine motor movements are normal in both hands.  Sensory is intact and symmetric to light touch, pinprick and proprioception.  DTR in the upper and lower extremity are present  and symmetric 1+, trace at right patella reflex. No clonus is noted.  Patient arises from chair without difficulty. Narrow based gait with normal arm swing bilateral , able to walk on heels and toes . Tandem walk is stable. No pronator drift. Rhomberg negative.  Good finger to nose and heel to shin testing. No tremor, dystaxia or dysmetria noted.        Assessment & Plan:  1. Lumbar post lami syndrome.Hx of Lumbar PSF L4-5, increasing pain with radiation into right anterior thigh, ordered MRI of L-spine, at last visit.  MRI result :  PSF L4-5 looks satisfactory. L2-3 worsened DDD, stenosis of lateral recess could cause neural  compression,  L3-4 worsened DDD, plus 2mm anterolisthesis, multifactorial stenosis, likely to cause neural compression. Referral to neurosurgeon, Dr. Danielle Dess . Explained MRI in great length and detail, at last visit.  R L3-4 ESI done in March,2013 , which did not help .  RTC 1 month PA visit, continue oxycodone 5 mg 4 times per day  Continue Valium 5 mg on a when necessary basis for muscle spasm she takes this less than once a day  Advised patient to continue with her walking and exercise program. Follow up in one month.

## 2012-12-16 NOTE — Patient Instructions (Signed)
Continue with your walking and exercise program 

## 2013-01-12 ENCOUNTER — Encounter
Payer: Medicare Other | Attending: Physical Medicine and Rehabilitation | Admitting: Physical Medicine and Rehabilitation

## 2013-01-12 DIAGNOSIS — M961 Postlaminectomy syndrome, not elsewhere classified: Secondary | ICD-10-CM | POA: Insufficient documentation

## 2013-01-12 DIAGNOSIS — M545 Low back pain, unspecified: Secondary | ICD-10-CM | POA: Insufficient documentation

## 2013-01-12 DIAGNOSIS — M51379 Other intervertebral disc degeneration, lumbosacral region without mention of lumbar back pain or lower extremity pain: Secondary | ICD-10-CM | POA: Insufficient documentation

## 2013-01-12 DIAGNOSIS — R209 Unspecified disturbances of skin sensation: Secondary | ICD-10-CM | POA: Insufficient documentation

## 2013-01-12 DIAGNOSIS — Z981 Arthrodesis status: Secondary | ICD-10-CM | POA: Insufficient documentation

## 2013-01-12 DIAGNOSIS — M5137 Other intervertebral disc degeneration, lumbosacral region: Secondary | ICD-10-CM | POA: Insufficient documentation

## 2013-01-12 DIAGNOSIS — Q762 Congenital spondylolisthesis: Secondary | ICD-10-CM | POA: Insufficient documentation

## 2013-01-13 ENCOUNTER — Encounter: Payer: Self-pay | Admitting: Physical Medicine and Rehabilitation

## 2013-01-13 ENCOUNTER — Encounter (HOSPITAL_BASED_OUTPATIENT_CLINIC_OR_DEPARTMENT_OTHER): Payer: Medicare Other | Admitting: Physical Medicine and Rehabilitation

## 2013-01-13 ENCOUNTER — Ambulatory Visit: Payer: Medicare Other | Admitting: Physical Medicine and Rehabilitation

## 2013-01-13 VITALS — BP 125/59 | HR 99 | Resp 14 | Ht 64.0 in | Wt 161.0 lb

## 2013-01-13 DIAGNOSIS — M961 Postlaminectomy syndrome, not elsewhere classified: Secondary | ICD-10-CM

## 2013-01-13 MED ORDER — CYCLOBENZAPRINE HCL 5 MG PO TABS: 5.0000 mg | ORAL_TABLET | Freq: Three times a day (TID) | ORAL | Status: DC

## 2013-01-13 MED ORDER — OXYCODONE HCL 5 MG PO CAPS: 5.0000 mg | ORAL_CAPSULE | Freq: Four times a day (QID) | ORAL | Status: DC

## 2013-01-13 NOTE — Progress Notes (Signed)
Subjective:    Patient ID: Shirley Hamilton, female    DOB: [birth year], 77 y.o.   MRN: [id number]  HPI The patient is a 77 year old female , who presents with chronic LBP . The symptoms started around 10 years ago. The patient complains about increasing moderate to severe pain , which radiate to the right thigh, and in the right posterior hip, which started after her last epidural. Patient also complains about numbness in the right anterior thigh.She describes the pain as aching . Applying heat, taking medications , changing positions alleviate the symptoms. Prolonged standing aggrevates the symptoms. The patient grades her pain as a 7 /10. Hx of PSF in L-spine. The patient reports, that Dr. Etta Quill changed her from Lexapro to Cymbalta.  Pain Inventory Average Pain 8 Pain Right Now 9 My pain is constant, sharp and stabbing  In the last 24 hours, has pain interfered with the following? General activity 7 Relation with others 6 Enjoyment of life 6 What TIME of day is your pain at its worst? daytime Sleep (in general) Fair  Pain is worse with: bending, standing and some activites Pain improves with: rest and medication Relief from Meds: 8  Mobility walk without assistance how many minutes can you walk? 30 ability to climb steps?  yes do you drive?  yes Do you have any goals in this area?  yes  Function retired Do you have any goals in this area?  yes  Neuro/Psych depression anxiety  Prior Studies Any changes since last visit?  no  Physicians involved in your care Any changes since last visit?  no   History reviewed. No pertinent family history. History   Social History  . Marital Status: Married    Spouse Name: N/A    Number of Children: N/A  . Years of Education: N/A   Social History Main Topics  . Smoking status: Never Smoker   . Smokeless tobacco: Never Used  . Alcohol Use: None  . Drug Use: None  . Sexually Active: None   Other Topics Concern  . None    Social History Narrative  . None   Past Surgical History  Procedure Laterality Date  . Spine surgery    . Knee surgery      right   Past Medical History  Diagnosis Date  . Neuromuscular disorder    BP 125/59  Pulse 99  Resp 14  Ht 5\' 4"  (1.626 m)  Wt 161 lb (73.029 kg)  BMI 27.62 kg/m2  SpO2 96%     Review of Systems  Constitutional: Positive for unexpected weight change.  Musculoskeletal: Positive for back pain.  Psychiatric/Behavioral: Positive for dysphoric mood. The patient is nervous/anxious.   All other systems reviewed and are negative.       Objective:   Physical Exam Constitutional: She is oriented to person, place, and time. She appears well-developed and well-nourished.  HENT:  Head: Normocephalic.  Neck: Neck supple.  Musculoskeletal: She exhibits tenderness.  Neurological: She is alert and oriented to person, place, and time.  Skin: Skin is warm and dry.  Psychiatric: She has a normal mood and affect.  Symmetric normal motor tone is noted throughout. Normal muscle bulk. Muscle testing reveals 5/5 muscle strength of the upper extremity, and 5/5 of the lower extremity. Full range of motion in upper and lower extremities. ROM of spine is restricted. Fine motor movements are normal in both hands.  Sensory is intact and symmetric to light touch, pinprick and proprioception.  DTR in the upper and lower extremity are present and symmetric 1+, trace at right patella reflex. No clonus is noted.  Patient arises from chair without difficulty. Narrow based gait with normal arm swing bilateral , able to walk on heels and toes . Tandem walk is stable. No pronator drift. Rhomberg negative.  Good finger to nose and heel to shin testing. No tremor, dystaxia or dysmetria noted.        Assessment & Plan:  1. Lumbar post lami syndrome.Hx of Lumbar PSF L4-5, increasing pain with radiation into right anterior thigh, ordered MRI of L-spine, at last visit.  MRI result  :  PSF L4-5 looks satisfactory. L2-3 worsened DDD, stenosis of lateral recess could cause neural compression,  L3-4 worsened DDD, plus 2mm anterolisthesis, multifactorial stenosis, likely to cause neural compression. Referral to neurosurgeon, Dr. Danielle Dess, she will see him today. . Explained MRI in great length and detail, at last visit.  R L3-4 ESI done in March,2013 , which did not help .  RTC 1 month PA visit, continue oxycodone 5 mg 4 times per day  Continue Valium 5 mg on a when necessary basis for muscle spasm she takes this less than once a day . The patient reports, that Dr. Etta Quill changed her from Lexapro to Cymbalta, she is also on Flexeril, I explained to the patient that with taking those 2 medications , there is a risk of a serotonin syndrome, she should talk to Dr. Etta Quill about this, and consider going back on the Lexapro, or changing her muscle relaxent to Robaxin. The patient states, that she will call her today.  Advised patient to continue with her walking and exercise program.  Follow up in one month.

## 2013-01-13 NOTE — Patient Instructions (Signed)
Continue with your walking and exercise program 

## 2013-01-17 ENCOUNTER — Other Ambulatory Visit: Payer: Self-pay | Admitting: Neurological Surgery

## 2013-01-19 ENCOUNTER — Telehealth: Payer: Self-pay

## 2013-01-19 MED ORDER — METHOCARBAMOL 500 MG PO TABS: 500.0000 mg | ORAL_TABLET | Freq: Three times a day (TID) | ORAL | Status: DC

## 2013-01-19 NOTE — Telephone Encounter (Signed)
Left message for patient to call office regarding her medications.  Robaxin was sent to pharmacy.

## 2013-01-19 NOTE — Telephone Encounter (Signed)
Patient informed robaxin sent in.

## 2013-01-19 NOTE — Telephone Encounter (Signed)
Patient called and says her insurance will not cover flexeril.  She does not get in to see Dr Danielle Dess until the end of the month.  She would like to go back on lexapro and robaxin.  Cymbalta did not help.  Please advise.

## 2013-01-19 NOTE — Telephone Encounter (Signed)
You can call in the robaxin, 500mg  tid prn muscle spasms/cramps, we do not prescribe lexapro or cymbalta, she has to call Dr. Candyce Churn concerning this medication

## 2013-01-31 ENCOUNTER — Encounter (HOSPITAL_COMMUNITY): Payer: Self-pay | Admitting: Pharmacy Technician

## 2013-02-07 ENCOUNTER — Encounter (HOSPITAL_COMMUNITY): Payer: Self-pay

## 2013-02-07 ENCOUNTER — Encounter (HOSPITAL_COMMUNITY)
Admission: RE | Admit: 2013-02-07 | Discharge: 2013-02-07 | Disposition: A | Discharge status: Home / Self Care | Payer: Medicare Other | Source: Ambulatory Visit | Attending: Neurological Surgery | Admitting: Neurological Surgery

## 2013-02-07 DIAGNOSIS — Q762 Congenital spondylolisthesis: Secondary | ICD-10-CM | POA: Insufficient documentation

## 2013-02-07 DIAGNOSIS — M4800 Spinal stenosis, site unspecified: Secondary | ICD-10-CM | POA: Insufficient documentation

## 2013-02-07 DIAGNOSIS — Z01812 Encounter for preprocedural laboratory examination: Secondary | ICD-10-CM | POA: Insufficient documentation

## 2013-02-07 HISTORY — DX: Headache: R51

## 2013-02-07 HISTORY — DX: Unspecified osteoarthritis, unspecified site: M19.90

## 2013-02-07 HISTORY — DX: Mental disorder, not otherwise specified: F99

## 2013-02-07 HISTORY — DX: Anxiety disorder, unspecified: F41.9

## 2013-02-07 LAB — SURGICAL PCR SCREEN
MRSA, PCR: NEGATIVE
Staphylococcus aureus: NEGATIVE

## 2013-02-07 LAB — CBC
Hemoglobin: 15.5 g/dL — ABNORMAL HIGH (ref 12.0–15.0)
MCV: 89.7 fL (ref 78.0–100.0)
Platelets: 222 10*3/uL (ref 150–400)
RDW: 13.7 % (ref 11.5–15.5)

## 2013-02-07 LAB — TYPE AND SCREEN
ABO/RH(D): O POS
Antibody Screen: NEGATIVE

## 2013-02-07 LAB — BASIC METABOLIC PANEL
CO2: 32 mEq/L (ref 19–32)
Chloride: 99 mEq/L (ref 96–112)
GFR calc Af Amer: >90 mL/min (ref >90)
Potassium: 3.5 mEq/L (ref 3.5–5.1)
Sodium: 138 mEq/L (ref 135–145)

## 2013-02-07 NOTE — Pre-Procedure Instructions (Signed)
Shirley Hamilton  02/07/2013   Your procedure is scheduled on:  02/14/13  Report to [facility] at 1130 AM.  Call this number if you have problems the morning of surgery: [phone]   Remember:   Do not eat food or drink liquids after midnight.   Take these medicines the morning of surgery with A SIP OF WATER: valium,cymbalta,robaxin,ocodone   Do not wear jewelry, make-up or nail polish.  Do not wear lotions, powders, or perfumes. You may wear deodorant.  Do not shave 48 hours prior to surgery. Men may shave face and neck.  Do not bring valuables to the hospital.  Contacts, dentures or bridgework may not be worn into surgery.  Leave suitcase in the car. After surgery it may be brought to your room.  For patients admitted to the hospital, checkout time is 11:00 AM the day of  discharge.   Patients discharged the day of surgery will not be allowed to drive  home.  Name and phone number of your driver: family  Special Instructions: Shower using CHG 2 nights before surgery and the night before surgery.  If you shower the day of surgery use CHG.  Use special wash - you have one bottle of CHG for all showers.  You should use approximately 1/3 of the bottle for each shower.   Please read over the following fact sheets that you were given: Pain Booklet, Coughing and Deep Breathing, Blood Transfusion Information, MRSA Information and Surgical Site Infection Prevention

## 2013-02-09 ENCOUNTER — Encounter: Payer: Self-pay | Admitting: Physical Medicine and Rehabilitation

## 2013-02-09 ENCOUNTER — Encounter
Payer: Medicare Other | Attending: Physical Medicine and Rehabilitation | Admitting: Physical Medicine and Rehabilitation

## 2013-02-09 VITALS — BP 122/72 | HR 82 | Resp 14 | Wt 153.0 lb

## 2013-02-09 DIAGNOSIS — M47817 Spondylosis without myelopathy or radiculopathy, lumbosacral region: Secondary | ICD-10-CM

## 2013-02-09 DIAGNOSIS — Z981 Arthrodesis status: Secondary | ICD-10-CM | POA: Insufficient documentation

## 2013-02-09 DIAGNOSIS — M961 Postlaminectomy syndrome, not elsewhere classified: Secondary | ICD-10-CM | POA: Insufficient documentation

## 2013-02-09 MED ORDER — OXYCODONE HCL 5 MG PO CAPS: 5.0000 mg | ORAL_CAPSULE | Freq: Four times a day (QID) | ORAL | Status: DC

## 2013-02-09 NOTE — Progress Notes (Signed)
Subjective:    Patient ID: Shirley Hamilton. Shirley Hamilton, female    DOB: 1936/09/19, 77 y.o.   MRN: [id number]  HPI The patient is a 77 year old female , who presents with chronic LBP . The symptoms started around 10 years ago. The patient complains about increasing moderate to severe pain , which radiate to the right thigh, and in the right posterior hip, which started after her last epidural. Patient also complains about numbness in the right anterior thigh.She describes the pain as aching . Applying heat, taking medications , changing positions alleviate the symptoms. Prolonged standing aggrevates the symptoms. The patient grades her pain as a 7 /10. Hx of PSF in L-spine. The patient reports, that Dr. Etta Hamilton changed her from Lexapro to Cymbalta. I referred her to Dr. Danielle Hamilton, who scheduled surgery, PSF L3-4 on April the 28th.   Pain Inventory Average Pain 7 Pain Right Now 7 My pain is sharp and burning  In the last 24 hours, has pain interfered with the following? General activity 5 Relation with others 4 Enjoyment of life 4 What TIME of day is your pain at its worst? morning and day Sleep (in general) Fair  Pain is worse with: standing Pain improves with: rest, pacing activities and medication Relief from Meds: 5  Mobility how many minutes can you walk? 15-30 ability to climb steps?  yes do you drive?  yes transfers alone Do you have any goals in this area?  yes  Function retired  Neuro/Psych numbness tingling spasms depression anxiety  Prior Studies Any changes since last visit?  no  Physicians involved in your care Any changes since last visit?  no   History reviewed. No pertinent family history. History   Social History  . Marital Status: Married    Spouse Name: N/A    Number of Children: N/A  . Years of Education: N/A   Social History Main Topics  . Smoking status: Never Smoker   . Smokeless tobacco: Never Used  . Alcohol Use: Yes     Comment: socially  .  Drug Use: No  . Sexually Active: None   Other Topics Concern  . None   Social History Narrative  . None   Past Surgical History  Procedure Laterality Date  . Spine surgery    . Knee surgery      right   Past Medical History  Diagnosis Date  . Neuromuscular disorder   . Anxiety   . Mental disorder   . Arthritis   . Headache    BP 122/72  Pulse 82  Resp 14  Wt 153 lb (69.4 kg)  BMI 26.25 kg/m2  SpO2 95%     Review of Systems  Gastrointestinal: Positive for constipation.  Musculoskeletal: Positive for back pain.  Neurological: Positive for numbness.  All other systems reviewed and are negative.       Objective:   Physical Exam Constitutional: She is oriented to person, place, and time. She appears well-developed and well-nourished.  HENT:  Head: Normocephalic.  Neck: Neck supple.  Musculoskeletal: She exhibits tenderness.  Neurological: She is alert and oriented to person, place, and time.  Skin: Skin is warm and dry.  Psychiatric: She has a normal mood and affect.  Symmetric normal motor tone is noted throughout. Normal muscle bulk. Muscle testing reveals 5/5 muscle strength of the upper extremity, and 5/5 of the lower extremity. Full range of motion in upper and lower extremities. ROM of spine is restricted. Fine motor movements are  normal in both hands.  Sensory is intact and symmetric to light touch, pinprick and proprioception.  DTR in the upper and lower extremity are present and symmetric 1+, trace at right patella reflex. No clonus is noted.  Patient arises from chair without difficulty. Narrow based gait with normal arm swing bilateral , able to walk on heels and toes . Tandem walk is stable. No pronator drift. Rhomberg negative.  Good finger to nose and heel to shin testing. No tremor, dystaxia or dysmetria noted.        Assessment & Plan:  1. Lumbar post lami syndrome.Hx of Lumbar PSF L4-5, increasing pain with radiation into right anterior  thigh, ordered MRI of L-spine, at last visit.  MRI result :  PSF L4-5 looks satisfactory. L2-3 worsened DDD, stenosis of lateral recess could cause neural compression,  L3-4 worsened DDD, plus 2mm anterolisthesis, multifactorial stenosis, likely to cause neural compression. Referral to neurosurgeon, Dr. Danielle Hamilton, he scheduled her surgery, PSF L3-4, on April the 28th.   R L3-4 ESI done in March,2013 , which did not help .  RTC 1 month PA visit, continue oxycodone 5 mg 4 times per day  Continue Valium 5 mg on a when necessary basis for muscle spasm she takes this less than once a day .  The patient reports, that Dr. Etta Hamilton changed her from Lexapro to Cymbalta, she is also on Flexeril, I explained to the patient that with taking those 2 medications , there is a risk of a serotonin syndrome, she should talk to Dr. Etta Hamilton about this, and consider going back on the Lexapro, or changing her muscle relaxent to Robaxin. The patient states, that she will call her today.  Advised patient to continue with her walking and exercise program, as tolerated, and as instructed after her surgery.  Follow up in one month.

## 2013-02-09 NOTE — Patient Instructions (Signed)
Stay as active as tolerated. 

## 2013-02-11 ENCOUNTER — Telehealth: Payer: Self-pay

## 2013-02-11 NOTE — Telephone Encounter (Signed)
Left message informing patient it was ok to take robaxin, cymbalta and gabapentin.

## 2013-02-11 NOTE — Telephone Encounter (Signed)
She can take the Robaxin with the cymbalta, and the gabapentin.

## 2013-02-11 NOTE — Telephone Encounter (Signed)
Patient would like to refill her robaxin and take that with her cymbalta.  She was wondering if it is ok to take gabapentin with this.  Please advise.

## 2013-02-13 MED ORDER — CEFAZOLIN SODIUM-DEXTROSE 2-3 GM-% IV SOLR
2.0000 g | INTRAVENOUS | Status: AC
  Administered 2013-02-14: 2 g via INTRAVENOUS
  Filled 2013-02-13: qty 50

## 2013-02-14 ENCOUNTER — Inpatient Hospital Stay (HOSPITAL_COMMUNITY)
Admission: RE | Admit: 2013-02-14 | Discharge: 2013-02-21 | DRG: 460 | Disposition: A | Discharge status: Home / Self Care | Payer: Medicare Other | Source: Ambulatory Visit | Attending: Neurological Surgery | Admitting: Neurological Surgery

## 2013-02-14 ENCOUNTER — Encounter (HOSPITAL_COMMUNITY): Payer: Self-pay | Admitting: Anesthesiology

## 2013-02-14 ENCOUNTER — Inpatient Hospital Stay (HOSPITAL_COMMUNITY): Payer: Medicare Other

## 2013-02-14 ENCOUNTER — Inpatient Hospital Stay (HOSPITAL_COMMUNITY): Payer: Medicare Other | Admitting: Anesthesiology

## 2013-02-14 ENCOUNTER — Other Ambulatory Visit: Payer: Self-pay

## 2013-02-14 ENCOUNTER — Encounter (HOSPITAL_COMMUNITY)
Admission: RE | Disposition: A | Discharge status: Home / Self Care | Payer: Self-pay | Source: Ambulatory Visit | Attending: Neurological Surgery

## 2013-02-14 DIAGNOSIS — M4316 Spondylolisthesis, lumbar region: Secondary | ICD-10-CM

## 2013-02-14 DIAGNOSIS — Z981 Arthrodesis status: Secondary | ICD-10-CM

## 2013-02-14 DIAGNOSIS — G8929 Other chronic pain: Secondary | ICD-10-CM | POA: Diagnosis present

## 2013-02-14 DIAGNOSIS — M48062 Spinal stenosis, lumbar region with neurogenic claudication: Secondary | ICD-10-CM | POA: Diagnosis present

## 2013-02-14 DIAGNOSIS — Z79899 Other long term (current) drug therapy: Secondary | ICD-10-CM

## 2013-02-14 DIAGNOSIS — M4310 Spondylolisthesis, site unspecified: Principal | ICD-10-CM | POA: Diagnosis present

## 2013-02-14 DIAGNOSIS — R339 Retention of urine, unspecified: Secondary | ICD-10-CM | POA: Diagnosis not present

## 2013-02-14 DIAGNOSIS — D62 Acute posthemorrhagic anemia: Secondary | ICD-10-CM | POA: Diagnosis not present

## 2013-02-14 DIAGNOSIS — M51379 Other intervertebral disc degeneration, lumbosacral region without mention of lumbar back pain or lower extremity pain: Secondary | ICD-10-CM | POA: Diagnosis present

## 2013-02-14 DIAGNOSIS — M5137 Other intervertebral disc degeneration, lumbosacral region: Secondary | ICD-10-CM | POA: Diagnosis present

## 2013-02-14 SURGERY — POSTERIOR LUMBAR FUSION 1 LEVEL
Anesthesia: General | Site: Spine Lumbar | Wound class: Clean

## 2013-02-14 MED ORDER — DULOXETINE HCL 30 MG PO CPEP
30.0000 mg | ORAL_CAPSULE | Freq: Every day | ORAL | Status: DC
  Administered 2013-02-14 – 2013-02-20 (×7): 30 mg via ORAL
  Filled 2013-02-14 (×9): qty 1

## 2013-02-14 MED ORDER — OXYCODONE HCL 5 MG/5ML PO SOLN: 5.0000 mg | Freq: Once | ORAL | Status: DC | PRN

## 2013-02-14 MED ORDER — BACITRACIN 50000 UNITS IM SOLR
INTRAMUSCULAR | Status: AC
  Filled 2013-02-14: qty 1

## 2013-02-14 MED ORDER — ADULT MULTIVITAMIN W/MINERALS CH
1.0000 | ORAL_TABLET | Freq: Every day | ORAL | Status: DC
  Administered 2013-02-15 – 2013-02-21 (×7): 1 via ORAL
  Filled 2013-02-14 (×7): qty 1

## 2013-02-14 MED ORDER — SODIUM CHLORIDE 0.9 % IJ SOLN
3.0000 mL | Freq: Two times a day (BID) | INTRAMUSCULAR | Status: DC
  Administered 2013-02-14 – 2013-02-20 (×9): 3 mL via INTRAVENOUS
  Administered 2013-02-20 – 2013-02-21 (×2): via INTRAVENOUS

## 2013-02-14 MED ORDER — FENTANYL CITRATE 0.05 MG/ML IJ SOLN
INTRAMUSCULAR | Status: DC | PRN
  Administered 2013-02-14: 50 ug via INTRAVENOUS
  Administered 2013-02-14: 100 ug via INTRAVENOUS
  Administered 2013-02-14 (×4): 50 ug via INTRAVENOUS
  Administered 2013-02-14: 150 ug via INTRAVENOUS

## 2013-02-14 MED ORDER — SODIUM CHLORIDE 0.9 % IV SOLN
INTRAVENOUS | Status: DC
  Administered 2013-02-15: 02:00:00 via INTRAVENOUS

## 2013-02-14 MED ORDER — THROMBIN 20000 UNITS EX SOLR
CUTANEOUS | Status: DC | PRN
  Administered 2013-02-14: 14:00:00 via TOPICAL

## 2013-02-14 MED ORDER — ACETAMINOPHEN 650 MG RE SUPP: 650.0000 mg | RECTAL | Status: DC | PRN

## 2013-02-14 MED ORDER — OXYCODONE-ACETAMINOPHEN 5-325 MG PO TABS
1.0000 | ORAL_TABLET | ORAL | Status: DC | PRN
  Administered 2013-02-15 – 2013-02-16 (×3): 2 via ORAL
  Administered 2013-02-16: 1 via ORAL
  Administered 2013-02-17 – 2013-02-19 (×5): 2 via ORAL
  Administered 2013-02-19: 1 via ORAL
  Administered 2013-02-19: 2 via ORAL
  Administered 2013-02-19 – 2013-02-20 (×3): 1 via ORAL
  Administered 2013-02-20: 2 via ORAL
  Administered 2013-02-20 (×2): 1 via ORAL
  Administered 2013-02-21 (×2): 2 via ORAL
  Administered 2013-02-21: 1 via ORAL
  Filled 2013-02-14: qty 2
  Filled 2013-02-14: qty 1
  Filled 2013-02-14 (×2): qty 2
  Filled 2013-02-14: qty 1
  Filled 2013-02-14 (×5): qty 2
  Filled 2013-02-14 (×4): qty 1
  Filled 2013-02-14 (×4): qty 2
  Filled 2013-02-14 (×2): qty 1

## 2013-02-14 MED ORDER — ACETAMINOPHEN 10 MG/ML IV SOLN
INTRAVENOUS | Status: AC
  Filled 2013-02-14: qty 100

## 2013-02-14 MED ORDER — ACETAMINOPHEN 325 MG PO TABS: 650.0000 mg | ORAL_TABLET | ORAL | Status: DC | PRN

## 2013-02-14 MED ORDER — HYDROMORPHONE HCL PF 1 MG/ML IJ SOLN
INTRAMUSCULAR | Status: AC
  Administered 2013-02-14: 0.5 mg via INTRAVENOUS
  Filled 2013-02-14: qty 1

## 2013-02-14 MED ORDER — VECURONIUM BROMIDE 10 MG IV SOLR
INTRAVENOUS | Status: DC | PRN
  Administered 2013-02-14 (×2): 1 mg via INTRAVENOUS

## 2013-02-14 MED ORDER — GLYCOPYRROLATE 0.2 MG/ML IJ SOLN
INTRAMUSCULAR | Status: DC | PRN
  Administered 2013-02-14: 0.6 mg via INTRAVENOUS

## 2013-02-14 MED ORDER — 0.9 % SODIUM CHLORIDE (POUR BTL) OPTIME
TOPICAL | Status: DC | PRN
  Administered 2013-02-14: 1000 mL

## 2013-02-14 MED ORDER — METHOCARBAMOL 500 MG PO TABS: 500.0000 mg | ORAL_TABLET | Freq: Three times a day (TID) | ORAL | Status: DC

## 2013-02-14 MED ORDER — OXYCODONE HCL 5 MG PO TABS: 5.0000 mg | ORAL_TABLET | Freq: Once | ORAL | Status: DC | PRN

## 2013-02-14 MED ORDER — SENNA 8.6 MG PO TABS
1.0000 | ORAL_TABLET | Freq: Two times a day (BID) | ORAL | Status: DC
  Administered 2013-02-15 – 2013-02-21 (×13): 8.6 mg via ORAL
  Filled 2013-02-14 (×16): qty 1

## 2013-02-14 MED ORDER — PHENOL 1.4 % MT LIQD: 1.0000 | OROMUCOSAL | Status: DC | PRN

## 2013-02-14 MED ORDER — METHOCARBAMOL 500 MG PO TABS
500.0000 mg | ORAL_TABLET | Freq: Four times a day (QID) | ORAL | Status: DC | PRN
  Administered 2013-02-17 – 2013-02-20 (×5): 500 mg via ORAL
  Filled 2013-02-14 (×5): qty 1

## 2013-02-14 MED ORDER — DOCUSATE SODIUM 100 MG PO CAPS
100.0000 mg | ORAL_CAPSULE | Freq: Two times a day (BID) | ORAL | Status: DC
  Administered 2013-02-14 – 2013-02-21 (×14): 100 mg via ORAL
  Filled 2013-02-14 (×14): qty 1

## 2013-02-14 MED ORDER — ONDANSETRON HCL 4 MG/2ML IJ SOLN: 4.0000 mg | INTRAMUSCULAR | Status: DC | PRN

## 2013-02-14 MED ORDER — SODIUM CHLORIDE 0.9 % IV SOLN: 250.0000 mL | INTRAVENOUS | Status: DC

## 2013-02-14 MED ORDER — LIDOCAINE HCL (CARDIAC) 20 MG/ML IV SOLN
INTRAVENOUS | Status: DC | PRN
  Administered 2013-02-14: 60 mg via INTRAVENOUS

## 2013-02-14 MED ORDER — FLEET ENEMA 7-19 GM/118ML RE ENEM: 1.0000 | ENEMA | Freq: Once | RECTAL | Status: AC | PRN

## 2013-02-14 MED ORDER — OXYCODONE HCL 5 MG PO CAPS: 5.0000 mg | ORAL_CAPSULE | Freq: Four times a day (QID) | ORAL | Status: DC

## 2013-02-14 MED ORDER — HYDROMORPHONE HCL PF 1 MG/ML IJ SOLN
0.5000 mg | INTRAMUSCULAR | Status: DC | PRN
  Administered 2013-02-15: 1 mg via INTRAVENOUS
  Filled 2013-02-14: qty 1

## 2013-02-14 MED ORDER — SODIUM CHLORIDE 0.9 % IR SOLN
Status: DC | PRN
  Administered 2013-02-14: 14:00:00

## 2013-02-14 MED ORDER — PROPOFOL 10 MG/ML IV BOLUS
INTRAVENOUS | Status: DC | PRN
  Administered 2013-02-14: 175 mg via INTRAVENOUS

## 2013-02-14 MED ORDER — HYDROMORPHONE HCL PF 1 MG/ML IJ SOLN
0.2500 mg | INTRAMUSCULAR | Status: DC | PRN
  Administered 2013-02-14 (×2): 0.5 mg via INTRAVENOUS

## 2013-02-14 MED ORDER — METOCLOPRAMIDE HCL 5 MG/ML IJ SOLN: 10.0000 mg | Freq: Once | INTRAMUSCULAR | Status: DC | PRN

## 2013-02-14 MED ORDER — ROCURONIUM BROMIDE 100 MG/10ML IV SOLN
INTRAVENOUS | Status: DC | PRN
  Administered 2013-02-14: 50 mg via INTRAVENOUS

## 2013-02-14 MED ORDER — BISACODYL 10 MG RE SUPP
10.0000 mg | Freq: Every day | RECTAL | Status: DC | PRN
  Administered 2013-02-16 – 2013-02-19 (×2): 10 mg via RECTAL
  Filled 2013-02-14 (×2): qty 1

## 2013-02-14 MED ORDER — CLOBETASOL PROPIONATE 0.05 % EX CREA
TOPICAL_CREAM | Freq: Two times a day (BID) | CUTANEOUS | Status: DC
  Administered 2013-02-14 – 2013-02-16 (×2): via TOPICAL
  Filled 2013-02-14: qty 15

## 2013-02-14 MED ORDER — CEFAZOLIN SODIUM 1-5 GM-% IV SOLN
1.0000 g | Freq: Three times a day (TID) | INTRAVENOUS | Status: AC
  Administered 2013-02-14 – 2013-02-15 (×2): 1 g via INTRAVENOUS
  Filled 2013-02-14 (×2): qty 50

## 2013-02-14 MED ORDER — ONDANSETRON HCL 4 MG/2ML IJ SOLN
INTRAMUSCULAR | Status: DC | PRN
  Administered 2013-02-14: 4 mg via INTRAVENOUS

## 2013-02-14 MED ORDER — OXYCODONE HCL 5 MG PO TABS
5.0000 mg | ORAL_TABLET | Freq: Four times a day (QID) | ORAL | Status: DC
  Administered 2013-02-14 – 2013-02-21 (×27): 5 mg via ORAL
  Filled 2013-02-14 (×28): qty 1

## 2013-02-14 MED ORDER — MIDAZOLAM HCL 5 MG/5ML IJ SOLN
INTRAMUSCULAR | Status: DC | PRN
  Administered 2013-02-14 (×2): 1 mg via INTRAVENOUS

## 2013-02-14 MED ORDER — DIAZEPAM 5 MG PO TABS
5.0000 mg | ORAL_TABLET | Freq: Two times a day (BID) | ORAL | Status: DC
  Administered 2013-02-15 – 2013-02-21 (×13): 5 mg via ORAL
  Filled 2013-02-14 (×14): qty 1

## 2013-02-14 MED ORDER — LACTATED RINGERS IV SOLN
INTRAVENOUS | Status: DC | PRN
  Administered 2013-02-14 (×3): via INTRAVENOUS

## 2013-02-14 MED ORDER — BUPIVACAINE HCL (PF) 0.5 % IJ SOLN
INTRAMUSCULAR | Status: DC | PRN
  Administered 2013-02-14: 5 mL
  Administered 2013-02-14: 20 mL

## 2013-02-14 MED ORDER — SUMATRIPTAN SUCCINATE 50 MG PO TABS
50.0000 mg | ORAL_TABLET | ORAL | Status: DC | PRN
  Filled 2013-02-14: qty 1

## 2013-02-14 MED ORDER — SODIUM CHLORIDE 0.9 % IJ SOLN: 3.0000 mL | INTRAMUSCULAR | Status: DC | PRN

## 2013-02-14 MED ORDER — ARTIFICIAL TEARS OP OINT
TOPICAL_OINTMENT | OPHTHALMIC | Status: DC | PRN
  Administered 2013-02-14: 1 via OPHTHALMIC

## 2013-02-14 MED ORDER — PHENYLEPHRINE HCL 10 MG/ML IJ SOLN
10.0000 mg | INTRAVENOUS | Status: DC | PRN
  Administered 2013-02-14: 15 ug/min via INTRAVENOUS

## 2013-02-14 MED ORDER — NEOSTIGMINE METHYLSULFATE 1 MG/ML IJ SOLN
INTRAMUSCULAR | Status: DC | PRN
  Administered 2013-02-14: 4 mg via INTRAVENOUS

## 2013-02-14 MED ORDER — LIDOCAINE-EPINEPHRINE 1 %-1:100000 IJ SOLN
INTRAMUSCULAR | Status: DC | PRN
  Administered 2013-02-14: 5 mL

## 2013-02-14 MED ORDER — MAGNESIUM OXIDE (LAXATIVE) 500 MG PO TABS: 2.0000 | ORAL_TABLET | Freq: Every day | ORAL | Status: DC | PRN

## 2013-02-14 MED ORDER — ZOLPIDEM TARTRATE 5 MG PO TABS
5.0000 mg | ORAL_TABLET | Freq: Every evening | ORAL | Status: DC | PRN
  Administered 2013-02-14 – 2013-02-20 (×6): 5 mg via ORAL
  Filled 2013-02-14 (×7): qty 1

## 2013-02-14 MED ORDER — ACETAMINOPHEN 10 MG/ML IV SOLN
1000.0000 mg | Freq: Once | INTRAVENOUS | Status: AC
  Administered 2013-02-14: 1000 mg via INTRAVENOUS

## 2013-02-14 MED ORDER — ALUM & MAG HYDROXIDE-SIMETH 200-200-20 MG/5ML PO SUSP: 30.0000 mL | Freq: Four times a day (QID) | ORAL | Status: DC | PRN

## 2013-02-14 MED ORDER — METHOCARBAMOL 100 MG/ML IJ SOLN
500.0000 mg | Freq: Four times a day (QID) | INTRAVENOUS | Status: DC | PRN
  Filled 2013-02-14: qty 5

## 2013-02-14 MED ORDER — MENTHOL 3 MG MT LOZG
1.0000 | LOZENGE | OROMUCOSAL | Status: DC | PRN
  Administered 2013-02-15: 3 mg via ORAL
  Filled 2013-02-14 (×2): qty 9

## 2013-02-14 MED ORDER — SODIUM CHLORIDE 0.9 % IV SOLN
INTRAVENOUS | Status: AC
  Filled 2013-02-14: qty 500

## 2013-02-14 MED ORDER — MAGNESIUM HYDROXIDE 400 MG/5ML PO SUSP
30.0000 mL | Freq: Every day | ORAL | Status: DC | PRN
  Administered 2013-02-15: 30 mL via ORAL
  Filled 2013-02-14: qty 30

## 2013-02-14 MED ORDER — EPHEDRINE SULFATE 50 MG/ML IJ SOLN
INTRAMUSCULAR | Status: DC | PRN
  Administered 2013-02-14: 10 mg via INTRAVENOUS
  Administered 2013-02-14: 5 mg via INTRAVENOUS
  Administered 2013-02-14: 10 mg via INTRAVENOUS

## 2013-02-14 SURGICAL SUPPLY — 66 items
ADH SKN CLS APL DERMABOND .7 (GAUZE/BANDAGES/DRESSINGS)
ADH SKN CLS LQ APL DERMABOND (GAUZE/BANDAGES/DRESSINGS) ×1
BAG DECANTER FOR FLEXI CONT (MISCELLANEOUS) ×2 IMPLANT
BLADE SURG ROTATE 9660 (MISCELLANEOUS) IMPLANT
BUR MATCHSTICK NEURO 3.0 LAGG (BURR) ×2 IMPLANT
CAGE PLIF 11MM SPINE (Cage) ×2 IMPLANT
CANISTER SUCTION 2500CC (MISCELLANEOUS) ×2 IMPLANT
CLOTH BEACON ORANGE TIMEOUT ST (SAFETY) ×2 IMPLANT
CONT SPEC 4OZ CLIKSEAL STRL BL (MISCELLANEOUS) ×4 IMPLANT
COVER BACK TABLE 24X17X13 BIG (DRAPES) IMPLANT
COVER TABLE BACK 60X90 (DRAPES) ×2 IMPLANT
DECANTER SPIKE VIAL GLASS SM (MISCELLANEOUS) ×2 IMPLANT
DERMABOND ADHESIVE PROPEN (GAUZE/BANDAGES/DRESSINGS) ×1
DERMABOND ADVANCED (GAUZE/BANDAGES/DRESSINGS)
DERMABOND ADVANCED .7 DNX12 (GAUZE/BANDAGES/DRESSINGS) ×1 IMPLANT
DERMABOND ADVANCED .7 DNX6 (GAUZE/BANDAGES/DRESSINGS) IMPLANT
DRAPE C-ARM 42X72 X-RAY (DRAPES) ×4 IMPLANT
DRAPE LAPAROTOMY 100X72X124 (DRAPES) ×2 IMPLANT
DRAPE POUCH INSTRU U-SHP 10X18 (DRAPES) ×2 IMPLANT
DRAPE PROXIMA HALF (DRAPES) IMPLANT
DURAPREP 26ML APPLICATOR (WOUND CARE) ×2 IMPLANT
ELECT REM PT RETURN 9FT ADLT (ELECTROSURGICAL) ×2
ELECTRODE REM PT RTRN 9FT ADLT (ELECTROSURGICAL) ×1 IMPLANT
GAUZE SPONGE 4X4 16PLY XRAY LF (GAUZE/BANDAGES/DRESSINGS) ×1 IMPLANT
GLOVE BIO SURGEON STRL SZ8 (GLOVE) ×1 IMPLANT
GLOVE BIOGEL PI IND STRL 7.0 (GLOVE) IMPLANT
GLOVE BIOGEL PI IND STRL 7.5 (GLOVE) IMPLANT
GLOVE BIOGEL PI IND STRL 8.5 (GLOVE) ×2 IMPLANT
GLOVE BIOGEL PI INDICATOR 7.0 (GLOVE) ×1
GLOVE BIOGEL PI INDICATOR 7.5 (GLOVE) ×2
GLOVE BIOGEL PI INDICATOR 8.5 (GLOVE) ×2
GLOVE ECLIPSE 7.5 STRL STRAW (GLOVE) ×2 IMPLANT
GLOVE ECLIPSE 8.5 STRL (GLOVE) ×4 IMPLANT
GLOVE EXAM NITRILE LRG STRL (GLOVE) IMPLANT
GLOVE EXAM NITRILE MD LF STRL (GLOVE) IMPLANT
GLOVE EXAM NITRILE XL STR (GLOVE) IMPLANT
GLOVE EXAM NITRILE XS STR PU (GLOVE) IMPLANT
GLOVE INDICATOR 8.5 STRL (GLOVE) ×1 IMPLANT
GLOVE SURG SS PI 7.0 STRL IVOR (GLOVE) ×4 IMPLANT
GOWN BRE IMP SLV AUR LG STRL (GOWN DISPOSABLE) IMPLANT
GOWN BRE IMP SLV AUR XL STRL (GOWN DISPOSABLE) ×5 IMPLANT
GOWN STRL REIN 2XL LVL4 (GOWN DISPOSABLE) ×4 IMPLANT
KIT BASIN OR (CUSTOM PROCEDURE TRAY) ×2 IMPLANT
KIT ROOM TURNOVER OR (KITS) ×2 IMPLANT
NEEDLE HYPO 22GX1.5 SAFETY (NEEDLE) ×2 IMPLANT
NS IRRIG 1000ML POUR BTL (IV SOLUTION) ×2 IMPLANT
PACK FOAM VITOSS 10CC (Orthopedic Implant) ×1 IMPLANT
PACK LAMINECTOMY NEURO (CUSTOM PROCEDURE TRAY) ×2 IMPLANT
PAD ARMBOARD 7.5X6 YLW CONV (MISCELLANEOUS) ×8 IMPLANT
PATTIES SURGICAL .5 X1 (DISPOSABLE) ×2 IMPLANT
ROD 45.5X40CM (Rod) ×2 IMPLANT
SCREW 40MM (Screw) ×1 IMPLANT
SPONGE GAUZE 4X4 12PLY (GAUZE/BANDAGES/DRESSINGS) ×1 IMPLANT
SPONGE LAP 4X18 X RAY DECT (DISPOSABLE) IMPLANT
SPONGE SURGIFOAM ABS GEL 100 (HEMOSTASIS) ×2 IMPLANT
SUT VIC AB 1 CT1 18XBRD ANBCTR (SUTURE) ×1 IMPLANT
SUT VIC AB 1 CT1 8-18 (SUTURE) ×2
SUT VIC AB 2-0 CP2 18 (SUTURE) ×2 IMPLANT
SUT VIC AB 3-0 SH 8-18 (SUTURE) ×3 IMPLANT
SYR 20ML ECCENTRIC (SYRINGE) ×2 IMPLANT
TAPE CLOTH SURG 4X10 WHT LF (GAUZE/BANDAGES/DRESSINGS) ×1 IMPLANT
TOWEL OR 17X24 6PK STRL BLUE (TOWEL DISPOSABLE) ×2 IMPLANT
TOWEL OR 17X26 10 PK STRL BLUE (TOWEL DISPOSABLE) ×2 IMPLANT
TRAP SPECIMEN MUCOUS 40CC (MISCELLANEOUS) ×2 IMPLANT
TRAY FOLEY CATH 14FRSI W/METER (CATHETERS) ×2 IMPLANT
WATER STERILE IRR 1000ML POUR (IV SOLUTION) ×2 IMPLANT

## 2013-02-14 NOTE — Transfer of Care (Signed)
Immediate Anesthesia Transfer of Care Note  Patient: Shirley Hamilton. Rolene Course  Procedure(s) Performed: Procedure(s) with comments: POSTERIOR LUMBAR FUSION 1 LEVEL (N/A) - Lumbar three-four Decompression/Fusion/Posterior lumbar interbody fusion/Pedicle screws  Patient Location: PACU  Anesthesia Type:General  Level of Consciousness: awake, alert , oriented and patient cooperative  Airway & Oxygen Therapy: Patient Spontanous Breathing and Patient connected to nasal cannula oxygen  Post-op Assessment: Report given to PACU RN, Post -op Vital signs reviewed and stable and Patient moving all extremities  Post vital signs: Reviewed and stable  Complications: No apparent anesthesia complications

## 2013-02-14 NOTE — Progress Notes (Signed)
Patient ID: Shirley Hamilton. [name], female   DOB: 04/11/1936, 77 y.o.   MRN: [id number] Oh stop checked.  Patient is awake and alert. Offers no significant complaints. Right leg feels well.  Incision is clean and dry. Motor function is intact in both lower extremities.  Stable postop

## 2013-02-14 NOTE — Anesthesia Postprocedure Evaluation (Signed)
  Anesthesia Post-op Note  Patient: Shirley Hamilton. Rolene Course  Procedure(s) Performed: Procedure(s) with comments: POSTERIOR LUMBAR FUSION 1 LEVEL (N/A) - Lumbar three-four Decompression/Fusion/Posterior lumbar interbody fusion/Pedicle screws  Patient Location: PACU  Anesthesia Type:General  Level of Consciousness: awake, alert  and oriented  Airway and Oxygen Therapy: Patient Spontanous Breathing and Patient connected to nasal cannula oxygen  Post-op Pain: mild  Post-op Assessment: Post-op Vital signs reviewed  Post-op Vital Signs: Reviewed  Complications: No apparent anesthesia complications

## 2013-02-14 NOTE — Anesthesia Preprocedure Evaluation (Signed)
Anesthesia Evaluation  Patient identified by MRN, date of birth, ID band Patient awake    Reviewed: Allergy & Precautions, H&P , NPO status , Patient's Chart, lab work & pertinent test results, reviewed documented beta blocker date and time   Airway Mallampati: II TM Distance: >3 FB Neck ROM: full    Dental   Pulmonary neg pulmonary ROS,  breath sounds clear to auscultation        Cardiovascular negative cardio ROS  Rhythm:regular     Neuro/Psych  Headaches, PSYCHIATRIC DISORDERS  Neuromuscular disease    GI/Hepatic negative GI ROS, Neg liver ROS,   Endo/Other  negative endocrine ROS  Renal/GU negative Renal ROS  negative genitourinary   Musculoskeletal   Abdominal   Peds  Hematology negative hematology ROS (+)   Anesthesia Other Findings See surgeon's H&P   Reproductive/Obstetrics negative OB ROS                           Anesthesia Physical Anesthesia Plan  ASA: II  Anesthesia Plan: General   Post-op Pain Management:    Induction: Intravenous  Airway Management Planned: Oral ETT  Additional Equipment:   Intra-op Plan:   Post-operative Plan: Extubation in OR  Informed Consent: I have reviewed the patients History and Physical, chart, labs and discussed the procedure including the risks, benefits and alternatives for the proposed anesthesia with the patient or authorized representative who has indicated his/her understanding and acceptance.   Dental Advisory Given  Plan Discussed with: CRNA and Surgeon  Anesthesia Plan Comments:         Anesthesia Quick Evaluation

## 2013-02-14 NOTE — H&P (Signed)
Chief complaint: Back and bilateral leg pain  HISTORY:     Shirley Hamilton returns to the office today to discuss the situation with her back in regards to chronic back pain.  She has had difficulties going on for some time now and has been in chronic pain management with Cly[facility] Cruder and she has had epidural steroid injections done on a couple of occasions by Dr. Ricarda Frame.   Recently because the pain has been significant, she underwent MRI of the lumbar spine.  This study was completed on 11/01/2012 and I had the opportunity to review it  with her.  She has evidence of fusion at L4-5 that was performed in 2004 by Dr. Hilda Lias.  Above the fusion, she has an area of moderately severe stenosis at L3-4.  This is due to a combination of centralized disc bulging in addition to marked facet hypertrophy coming to cause the moderately severe stenosis.  At L2-3 above this, she has a very mild stenosis again secondary to some hypertrophy of the facets but mostly due to centralized bulging of the disc.     I obtained a lateral flexion and extension film of the lumbar spine.  Two radiographs were reviewed, one in extension and one in flexion and these revealed the presence of 4 mm of anterolisthesis at L3-4 in flexion compared to the neutral position in extension.  The pedicle fixation appears to be quite intact with a mature arthrodesis at the L4-5 level.  The levels above appear to be normal.    Shirley Hamilton tells me that the worst of her pain occurs in that right anterior thigh. She tells me with the last epidural steroid injection, she had exacerbated pain in the anterior leg and thigh.  Since then, she has been cautious about receiving any further epidurals.    PAST MEDICAL HISTORY:  Her general health has been good.  She does have the chronic back pain as noted.   ALLERGIES:     She notes an intolerance to Morphine and Erythromycin.  MEDICATIONS:    A complete medication list includes Biotin, Cyclobenzaprine,  Gabapentin, Oxycodone, Sumatriptan, Vitamin B12, Cymbalta 30 mg. q.d.    REVIEW OF SYSTEMS:   Notable for wearing of glasses and some ear pain.     PHYSICAL EXAMINATION:  On physical examination, I note she stands straight and erect.  Her motor function appears good in the major groups including the iliopsoas, quads, tibialis anterior and the gastrocs.  She has 2+ reflexes in both patellae and 1+ reflexes in both Achilles.  Her station and gait appear quite normal.    IMPRESSION:    I indicated to Shirley Hamilton that she has moderately severe stenosis at the level of L3-4 immediately adjacent to her previous fusion.  This situation can be treated conservative as tolerated but if her pain and discomfort is so significant and severe then ultimately she may need to consider adjacent level decompression and arthrodesis at L3-4.  I note also that she has a very minimal spondylolisthesis at this point and if symptoms are tolerable we can simply see how she does with further conservative management and the passage of time.  Should her symptoms worsen, then certainly I would advise surgical decompression and fusion.  I also note that epidural steroid injections should be able to be given fairly safely at the level above the worse of her stenosis and this should affect the pain also.  This would be another consideration to help manage conservatively.  Shirley Hamilton  tells me that there is a lot going on now with her recent move and thinning of belongings.  If this all settles down, then ultimately she may want to consider either the surgery or further conservative management as time passes. Having failed that, she is admitted for surgery today.

## 2013-02-14 NOTE — Anesthesia Procedure Notes (Signed)
Procedure Name: Intubation Date/Time: 02/14/2013 12:56 PM Performed by: Gayla Medicus Pre-anesthesia Checklist: Patient identified, Timeout performed, Suction available, Emergency Drugs available and Patient being monitored Patient Re-evaluated:Patient Re-evaluated prior to inductionOxygen Delivery Method: Circle system utilized Preoxygenation: Pre-oxygenation with 100% oxygen Intubation Type: IV induction Ventilation: Mask ventilation without difficulty Laryngoscope Size: Mac and 3 Grade View: Grade I Tube type: Oral Tube size: 7.0 mm Number of attempts: 1 Airway Equipment and Method: Stylet Placement Confirmation: ETT inserted through vocal cords under direct vision,  positive ETCO2 and breath sounds checked- equal and bilateral Secured at: 21 cm Tube secured with: Tape Dental Injury: Teeth and Oropharynx as per pre-operative assessment

## 2013-02-14 NOTE — Preoperative (Signed)
Beta Blockers   Reason not to administer Beta Blockers:Not Applicable 

## 2013-02-14 NOTE — Op Note (Signed)
Date of surgery: 02/14/2013 Preoperative diagnosis: Spondylolisthesis and stenosis L3-L4 status post arthrodesis L4-L5, right lumbar radiculopathy L3 Postoperative diagnosis: Spondylolisthesis and stenosis L3-L4, status post arthrodesis L4-L5, right lumbar radiculopathy L3 Procedure: Removal of old hardware L4-L5, decompression L3-L4 via bilateral laminotomies complete facetectomies decompression of L3 and L4 nerve roots bilaterally with more work than require for simple interbody arthrodesis, posterior interbody arthrodesis with peek spacers local autograft and allograft pedicle screw fixation L3-L4 with posterior lateral arthrodesis using allograft.  Surgeon: Barnett Abu M.D. Assistant: Daryl Eastern M.D. Anesthesia: Gen. endotracheal  Indications: Patient is a 77 year old individual is had significant back and right lower extremity pain she's had previous decompression arthrodesis at L4-L5 secondary to degenerative spondylolisthesis there she has done well for a number of years but now has an acute right lumbar radiculopathy that has been refractory to conservative efforts an MRI demonstrates severe stenosis secondary to a combination of facet hypertrophy thickening of the interspinous ligament and a spondylolisthesis with degeneration of the disc at L3-L4. She's been advised regarding surgical decompression arthrodesis.  Procedure: The patient was brought to the operating room supine on a stretcher. After the smooth induction of general endotracheal anesthesia, she was turned prone, the back was prepped with alcohol and DuraPrep, and then draped sterilely. A midline incision was created through the old scar. The dissection was carried down through the lumbar dorsal fascia which was opened on either side of midline at the level of the last spinous process. A self-retaining retractor was placed in the wound. The hardware was exposed. The screw caps were then removed this was noted to be an older style of  [facility] instrumentation. Screws were removed. Then L3-L4 was identified positively and large laminotomies were created removing the inferior margin of the lamina and facet complex of the L3-L4 facet complex. This exposed the common dural tube. Care was taken then to dissect the common dural tube from thickened and redundant interspinous ligamentous material centrally and thickened yellow ligament this material in the lateral aspects superiorly than the L3 nerve roots were noted to be stenotic secondary to this overgrowth of ligamentous material. Particularly on the right side the L3 nerve root could be sounded after decompressing a significant ligamentous overgrowth within the foramen itself. The disc in this area was also noted to be 12 out causing some of the stenosis. The nerve root was isolated. Inferior to this the L4 nerve root was decompressed in a similar fashion here finding adherent scar tissue from the previous dissection at the level of L4-L5. Once these roots were isolated on both sides and then proceeded to perform a complete discectomy at the level of L3-L4. A series of disc shavers were used to enlarge the interspace and remove endplate material from the inferior margin body of L3 superior margin the body of L4. As completed discectomy as could be performed was completed and the endplates were curetted. Spacers were then trialed and 11 mm peek spacers were then chosen these were 23 mm deep bilateral millimeters tall spacers that were filled with combination of patient's own bone and some the cost. The interspace was packed with autologous bone that had been harvested from the facetectomies and laminectomies. Once the space was filled with this combination materials the test was also packed into the lateral gutters. Attention was then turned to pedicle entry sites and fluoroscopic guidance was used to place pedicle screws at level of L3 pedicle entry sites were initially sounded and then tapped  with a 6.5  mm tap and 6.5 mm x 40 mm screws were placed into the vertebrae of L3 area 6.5 x 40 mm screws were then placed into the previously tapped holes at L4. Short 30 mm connecting rods were used to connect this construct. Construct was placed in a slight bit of lordosis. Final radiographs were obtained in AP and lateral projection after packing the lateral gutters the remainder of the cost the lateral gutters had previously been decorticated.  At this point a careful inspection of the wound and the L3 and L4 nerve roots was performed when these were identified and noted to be decompressed and hemostasis was well established the lumbar dorsal fascia was closed with #1 Vicryl 2-0 Vicryl was used in the subcutaneous tissues, 3-0 Vicryl is used to close the subcuticular skin. Dermabond was then used on the skin. Blood loss was estimated at 600 cc, 200 cc of Cell Saver blood was returned to the patient.

## 2013-02-15 LAB — BASIC METABOLIC PANEL
CO2: 30 mEq/L (ref 19–32)
Calcium: 8.2 mg/dL — ABNORMAL LOW (ref 8.4–10.5)
GFR calc non Af Amer: 85 mL/min — ABNORMAL LOW (ref >90)
Potassium: 3.8 mEq/L (ref 3.5–5.1)
Sodium: 136 mEq/L (ref 135–145)

## 2013-02-15 LAB — CBC
MCH: 30.5 pg (ref 26.0–34.0)
Platelets: 184 10*3/uL (ref 150–400)
RBC: 3.83 MIL/uL — ABNORMAL LOW (ref 3.87–5.11)
WBC: 8.4 10*3/uL (ref 4.0–10.5)

## 2013-02-15 NOTE — Progress Notes (Signed)
Utilization Review Completed.Shirley Hamilton T4/29/2014  

## 2013-02-15 NOTE — Evaluation (Signed)
Occupational Therapy Evaluation Patient Details Name: Shirley Hamilton MRN: [id number] DOB: 06/07/1936 Today's Date: 02/15/2013 Time: 7829-5621 OT Time Calculation (min): 24 min  OT Assessment / Plan / Recommendation Clinical Impression  Pt demos decline in function with ADLs and ADL mobility folllowing hardware removal L4-5 and L3-4 facetectomies and arthrodesis. Pt would benefit from OT services to address impairments below to help retsore PLOF to return home safely    OT Assessment  Patient needs continued OT Services    Follow Up Recommendations  Home health OT;Supervision - Intermittent    Barriers to Discharge None    Equipment Recommendations  None recommended by OT    Recommendations for Other Services    Frequency  Min 2X/week    Precautions / Restrictions Precautions Precautions: Back Precaution Comments: pt able to recall 3/3 back precautions Required Braces or Orthoses: Spinal Brace Spinal Brace: Lumbar corset;Applied in sitting position Restrictions Weight Bearing Restrictions: No   Pertinent Vitals/Pain 7/10 back, 10/10 headache    ADL  Grooming: Performed;Wash/dry hands;Supervision/safety;Set up Where Assessed - Grooming: Unsupported sitting Upper Body Bathing: Simulated;Supervision/safety;Set up Lower Body Bathing: Simulated;Maximal assistance Upper Body Dressing: Performed;Supervision/safety;Set up;Other (comment) (assist to donn back brace) Lower Body Dressing: Maximal assistance;Performed Toilet Transfer: Min guard;Simulated Toilet Transfer Method: Sit to stand Toileting - Architect and Hygiene: Performed;Moderate assistance Where Assessed - Toileting Clothing Manipulation and Hygiene: Standing Transfers/Ambulation Related to ADLs: verbal cues for corect hand placement ADL Comments: Pt familiar with ADL A/E kit from previous back surgeries, reviewed equipment/demo'd use with pt    OT Diagnosis: Generalized weakness;Acute pain  OT  Problem List: Decreased strength;Decreased knowledge of use of DME or AE;Decreased knowledge of precautions;Decreased activity tolerance;Impaired balance (sitting and/or standing);Pain OT Treatment Interventions: Self-care/ADL training;Balance training;Therapeutic exercise;Neuromuscular education;Therapeutic activities;DME and/or AE instruction;Patient/family education   OT Goals Acute Rehab OT Goals OT Goal Formulation: With patient Time For Goal Achievement: 02/22/13 Potential to Achieve Goals: Good ADL Goals Pt Will Perform Grooming: with min assist;Standing at sink ADL Goal: Grooming - Progress: Goal set today Pt Will Perform Lower Body Bathing: with mod assist;with min assist;with adaptive equipment ADL Goal: Lower Body Bathing - Progress: Goal set today Pt Will Perform Lower Body Dressing: with min assist;with mod assist;with adaptive equipment ADL Goal: Lower Body Dressing - Progress: Goal set today Pt Will Transfer to Toilet: with supervision;with DME;Grab bars ADL Goal: Toilet Transfer - Progress: Goal set today Pt Will Perform Toileting - Clothing Manipulation: with min assist ADL Goal: Toileting - Clothing Manipulation - Progress: Goal set today Pt Will Perform Toileting - Hygiene: with min assist;with adaptive equipment ADL Goal: Toileting - Hygiene - Progress: Goal set today Pt Will Perform Tub/Shower Transfer: with supervision;with DME;Grab bars ADL Goal: Tub/Shower Transfer - Progress: Goal set today  Visit Information  Last OT Received On: 02/15/13 Assistance Needed: +1    Subjective Data  Subjective: " I have had a bad migraine since Friday ' Patient Stated Goal: To return home   Prior Functioning     Home Living Lives With: Spouse Available Help at Discharge: Family;Available 24 hours/day Type of Home: Apartment Home Access: Level entry;Elevator Home Layout: One level Bathroom Shower/Tub: Health visitor: Handicapped height Bathroom  Accessibility: Yes How Accessible: Accessible via walker Home Adaptive Equipment: Straight cane;Shower chair without back;Grab bars around toilet;Grab bars in [employer] - standard;Walker - four wheeled Additional Comments: husband has Parkinson's; is independent Prior Function Level of Independence: Independent Able to Take Stairs?: Yes Driving: Yes Vocation: Retired Special educational needs teacher  Communication: No difficulties Dominant Hand: Right         Vision/Perception Vision - History Baseline Vision: Wears glasses all the time Patient Visual Report: No change from baseline Perception Perception: Within Functional Limits   Cognition  Cognition Arousal/Alertness: Awake/alert Behavior During Therapy: WFL for tasks assessed/performed Overall Cognitive Status: Within Functional Limits for tasks assessed    Extremity/Trunk Assessment Right Upper Extremity Assessment RUE ROM/Strength/Tone: WFL for tasks assessed RUE Sensation: WFL - Light Touch RUE Coordination: WFL - gross/fine motor Left Upper Extremity Assessment LUE ROM/Strength/Tone: WFL for tasks assessed LUE Sensation: WFL - Light Touch LUE Coordination: WFL - gross/fine motor     Mobility Bed Mobility Bed Mobility: Sit to Sidelying Left;Rolling Right Rolling Right: 4: Min assist Sit to Sidelying Left: 4: Min assist Details for Bed Mobility Assistance: pt instructed in proper sequencing to maintain back precautions; required assist to prevent twisting as returning to bed and rolling side to supine Transfers Transfers: Sit to Stand;Stand to Sit Sit to Stand: 4: Min guard;From bed Stand to Sit: 4: Min guard;To bed Details for Transfer Assistance: for safety due to pain meds and fatigue     Exercise     Balance Balance Balance Assessed: Yes Static Sitting Balance Static Sitting - Balance Support: No upper extremity supported;Feet supported Static Sitting - Level of Assistance: 7: Independent Static Standing  Balance Static Standing - Balance Support: No upper extremity supported Static Standing - Level of Assistance: 5: Stand by assistance Dynamic Standing Balance Dynamic Standing - Balance Support: Left upper extremity supported;During functional activity Dynamic Standing - Level of Assistance: 4: Min assist   End of Session OT - End of Session Equipment Utilized During Treatment: Back brace;Other (comment) (RW) Activity Tolerance: Patient limited by fatigue;Patient limited by pain Patient left: with call bell/phone within reach  GO     Galen Manila 02/15/2013, 3:41 PM

## 2013-02-15 NOTE — Clinical Social Work Note (Signed)
Clinical Social Work  CSW received consult for SNF. CSW reviewed chart. PT is recommending HHPT. CSW updated RNCM. CSW is signing off as no further needs identified. Please reconsult if need arises prior to discharge.   Dede Query, MSW, LCSW [phone]

## 2013-02-15 NOTE — Evaluation (Signed)
Physical Therapy Evaluation Patient Details Name: Shirley Hamilton MRN: [id number] DOB: Jul 31, 1936 Today's Date: 02/15/2013 Time: 1022-1049 PT Time Calculation (min): 27 min  PT Assessment / Plan / Recommendation Clinical Impression  Patient is s/p hardware removal L4-5 and L3-4 facetectomies and arthrodesis surgery resulting in the deficits listed below (PT Problem List). Patient will benefit from skilled PT to increase their independence and safety with mobility (while adhering to their back precautions) to allow discharge home with husband's limited assistance.     PT Assessment  Patient needs continued PT services    Follow Up Recommendations  Home health PT;Supervision - Intermittent (for safety evaluation)    Does the patient have the potential to tolerate intense rehabilitation      Barriers to Discharge None      Equipment Recommendations  None recommended by PT    Recommendations for Other Services OT consult   Frequency Min 5X/week    Precautions / Restrictions Precautions Precautions: Back Precaution Booklet Issued: Yes (comment) Required Braces or Orthoses: Spinal Brace Spinal Brace: Lumbar corset;Applied in sitting position   Pertinent Vitals/Pain Back "not too bad"; educated on precautions and reposistioned      Mobility  Bed Mobility Bed Mobility: Sit to Sidelying Left;Rolling Right Rolling Right: 4: Min assist Sit to Sidelying Left: 4: Min assist Details for Bed Mobility Assistance: pt instructed in proper sequencing to maintain back precautions; required assist to prevent twisting as returning to bed and rolling side to supine Transfers Transfers: Sit to Stand;Stand to Sit Sit to Stand: 4: Min guard Stand to Sit: 4: Min guard Details for Transfer Assistance: for safety due to pain meds and woozy Ambulation/Gait Ambulation/Gait Assistance: 4: Min guard Ambulation Distance (Feet): 200 Feet Assistive device: None Ambulation/Gait Assistance Details:  close guarding for safety due to pain meds and feeling a bit woozy; no limping noted without device Gait Pattern: Step-through pattern Gait velocity: slightly decr    Exercises Other Exercises Other Exercises: pt asking about working on "core exercises" instructed pt not to perform any exercises until her surgeon OKs; educated on importance of ambulation and to call for assist   PT Diagnosis: Difficulty walking;Acute pain  PT Problem List: Decreased balance;Decreased mobility;Decreased knowledge of precautions;Pain PT Treatment Interventions: Gait training;Functional mobility training;Therapeutic activities;Patient/family education   PT Goals Acute Rehab PT Goals PT Goal Formulation: With patient Time For Goal Achievement: 02/18/13 Potential to Achieve Goals: Good Pt will Roll Supine to Left Side: with supervision PT Goal: Rolling Supine to Left Side - Progress: Goal set today Pt will go Supine/Side to Sit: with supervision PT Goal: Supine/Side to Sit - Progress: Goal set today Pt will go Sit to Supine/Side: with supervision PT Goal: Sit to Supine/Side - Progress: Goal set today Pt will go Sit to Stand: with supervision PT Goal: Sit to Stand - Progress: Goal set today Pt will Ambulate: >150 feet;with supervision PT Goal: Ambulate - Progress: Goal set today Additional Goals Additional Goal #1: Pt will verbalize and adhere to back precautions. PT Goal: Additional Goal #1 - Progress: Goal set today  Visit Information  Last PT Received On: 02/15/13 Assistance Needed: +1    Subjective Data  Subjective: I don't want to use a walker at home Patient Stated Goal: go home in 3-5 days   Prior Functioning  Home Living Lives With: Spouse Available Help at Discharge: Family;Available 24 hours/day Type of Home: Apartment Home Access: Level entry;Elevator Home Layout: One level Bathroom Toilet: Handicapped height Bathroom Accessibility: Yes How Accessible:  Accessible via walker Home  Adaptive Equipment: Straight cane;Shower chair without back;Grab bars around toilet;Grab bars in [employer] - standard;Walker - four wheeled Additional Comments: husband has Parkinson's; is independent Prior Function Level of Independence: Independent Able to Take Stairs?: Yes Communication Communication: No difficulties    Cognition       Extremity/Trunk Assessment Right Lower Extremity Assessment RLE ROM/Strength/Tone: WFL for tasks assessed RLE Sensation: Deficits RLE Sensation Deficits: numb, pins and needles ant thigh (better than prior to surgery) RLE Coordination: WFL - gross motor Left Lower Extremity Assessment LLE ROM/Strength/Tone: WFL for tasks assessed LLE Sensation: WFL - Light Touch LLE Coordination: WFL - gross motor Trunk Assessment Trunk Assessment: Normal   Balance Balance Balance Assessed: Yes Static Sitting Balance Static Sitting - Balance Support: No upper extremity supported;Feet supported Static Sitting - Level of Assistance: 7: Independent Static Standing Balance Static Standing - Balance Support: No upper extremity supported Static Standing - Level of Assistance: 5: Stand by assistance Static Standing - Comment/# of Minutes: for safety  End of Session PT - End of Session Equipment Utilized During Treatment: Back brace Activity Tolerance: Patient tolerated treatment well Patient left: in bed;with call bell/phone within reach  GP     Shirley Hamilton 02/15/2013, 11:04 AM   02/15/2013 Veda Canning, PT Pager: [phone]

## 2013-02-15 NOTE — Progress Notes (Signed)
Subjective: Patient reports Notes right leg feels better other than headache has little complaints to offer. Back soreness tolerable.  Objective: Vital signs in last 24 hours: Temp:  [97.9 F (36.6 C)-100.3 F (37.9 C)] 100.3 F (37.9 C) (04/29 0950) Pulse Rate:  [63-99] 90 (04/29 0950) Resp:  [16-31] 18 (04/29 0950) BP: (94-128)/(43-80) 119/67 mmHg (04/29 0950) SpO2:  [95 %-100 %] 96 % (04/29 0950)  Intake/Output from previous day: 04/28 0701 - 04/29 0700 In: 2280 [I.V.:2000; Blood:220] Out: 1525 [Urine:975; Blood:550] Intake/Output this shift:    Wound: incision is clean and dry. Motor function is good in both lower extremities.  Lab Results:  Recent Labs  02/15/13 0700  WBC 8.4  HGB 11.7*  HCT 34.4*  PLT 184   BMET  Recent Labs  02/15/13 0700  NA 136  K 3.8  CL 100  CO2 30  GLUCOSE 112*  BUN 8  CREATININE 0.65  CALCIUM 8.2*    Studies/Results: Dg Lumbar Spine 2-3 Views  02/14/2013  *RADIOLOGY REPORT*  Clinical Data:Posterior lumbar fusion  DG C-ARM 1-60 MIN,LUMBAR SPINE - 2-3 VIEW  Fluoroscopy time:  21 seconds.  Comparison: 01/13/2013  Findings: AP and lateral intraoperative spot images demonstrate changes of posterior fusion at L3-4.  Interval removal of hardware at L5.  No hardware or bony complicating feature.  IMPRESSION: Interval L3-4 posterior fusion.   Original Report Authenticated By: Charlett Nose, M.D.    Dg C-arm 1-60 Min  02/14/2013  *RADIOLOGY REPORT*  Clinical Data:Posterior lumbar fusion  DG C-ARM 1-60 MIN,LUMBAR SPINE - 2-3 VIEW  Fluoroscopy time:  21 seconds.  Comparison: 01/13/2013  Findings: AP and lateral intraoperative spot images demonstrate changes of posterior fusion at L3-4.  Interval removal of hardware at L5.  No hardware or bony complicating feature.  IMPRESSION: Interval L3-4 posterior fusion.   Original Report Authenticated By: Charlett Nose, M.D.     Assessment/Plan: Stable postop labs demonstrate blood sugar only slightly  elevated renal function normal electrolytes intact. Hemoglobin 11.2 hematocrit 34.  LOS: 1 day  Mobilize as tolerated on the floor.   Taquana Bartley J 02/15/2013, 11:22 AM

## 2013-02-15 NOTE — Plan of Care (Signed)
Problem: Phase II Progression Outcomes Goal: Discharge plan established Recommend HH OT for ADL trg and ADL mobility safety trg after acute care d/c     

## 2013-02-16 MED FILL — Sodium Chloride IV Soln 0.9%: INTRAVENOUS | Qty: 2000 | Status: AC

## 2013-02-16 MED FILL — Heparin Sodium (Porcine) Inj 1000 Unit/ML: INTRAMUSCULAR | Qty: 30 | Status: AC

## 2013-02-16 NOTE — Progress Notes (Addendum)
   CARE MANAGEMENT NOTE 02/16/2013  Patient:  Shirley Hamilton, Shirley Hamilton.   Account Number:  [id number]  Date Initiated:  02/15/2013  Documentation initiated by:  [facility]  Subjective/Objective Assessment:   admitted postop PLIF L3-4  lives with spouse     Action/Plan:   PT/OT evals   Anticipated DC Date:  02/17/2013   Anticipated DC Plan:  HOME W HOME HEALTH SERVICES      DC Planning Services  CM consult      [facility] Choice  HOME HEALTH   Choice offered to / List presented to:  C-1 Patient        HH arranged  HH-2 PT HH-  OT      HH agency  [facility]   Status of service:  Completed, signed off Medicare Important Message given?   (If response is "NO", the following Medicare IM given date fields will be blank) Date Medicare IM given:   Date Additional Medicare IM given:    Discharge Disposition:  HOME W HOME HEALTH SERVICES  Per UR Regulation:  Reviewed for med. necessity/level of care/duration of stay  If discussed at Long Length of Stay Meetings, dates discussed:    Comments:  02/16/2013 1530 NCM spoke to pt and offered choice for [facility]. Pt agreeable to [facility] for HH PTOT. States she believes [facility] is agency that service [place] facility. She is a residence at [facility]. States she ambulates well and no DME is needed. States her husband has RW and rollator at home. Notified [facility] of HH needed. Isidoro Donning RN CCM Case Mgmt phone [phone]

## 2013-02-16 NOTE — Progress Notes (Signed)
Subjective: Patient reports Typical postoperative back soreness. Legs feel better though some twinges in the right side persists.  Objective: Vital signs in last 24 hours: Temp:  [98.5 F (36.9 C)-100.3 F (37.9 C)] 99.3 F (37.4 C) (04/30 0610) Pulse Rate:  [75-92] 79 (04/30 0610) Resp:  [16-18] 16 (04/30 0610) BP: (96-123)/(42-70) 96/42 mmHg (04/30 0610) SpO2:  [94 %-99 %] 96 % (04/30 0610) Weight:  [72.848 kg (160 lb 9.6 oz)] 72.848 kg (160 lb 9.6 oz) (04/30 0610)  Intake/Output from previous day:   Intake/Output this shift:    Dressing clean and dry. Motor function intact in lower extremities.  Lab Results:  Recent Labs  02/15/13 0700  WBC 8.4  HGB 11.7*  HCT 34.4*  PLT 184   BMET  Recent Labs  02/15/13 0700  NA 136  K 3.8  CL 100  CO2 30  GLUCOSE 112*  BUN 8  CREATININE 0.65  CALCIUM 8.2*    Studies/Results: Dg Lumbar Spine 2-3 Views  02/14/2013  *RADIOLOGY REPORT*  Clinical Data:Posterior lumbar fusion  DG C-ARM 1-60 MIN,LUMBAR SPINE - 2-3 VIEW  Fluoroscopy time:  21 seconds.  Comparison: 01/13/2013  Findings: AP and lateral intraoperative spot images demonstrate changes of posterior fusion at L3-4.  Interval removal of hardware at L5.  No hardware or bony complicating feature.  IMPRESSION: Interval L3-4 posterior fusion.   Original Report Authenticated By: Charlett Nose, M.D.    Dg C-arm 1-60 Min  02/14/2013  *RADIOLOGY REPORT*  Clinical Data:Posterior lumbar fusion  DG C-ARM 1-60 MIN,LUMBAR SPINE - 2-3 VIEW  Fluoroscopy time:  21 seconds.  Comparison: 01/13/2013  Findings: AP and lateral intraoperative spot images demonstrate changes of posterior fusion at L3-4.  Interval removal of hardware at L5.  No hardware or bony complicating feature.  IMPRESSION: Interval L3-4 posterior fusion.   Original Report Authenticated By: Charlett Nose, M.D.     Assessment/Plan: Stable postop  LOS: 2 days  Continue to encourage mobility. Laxatives for encouraging bowel  movement. Plan discharge back and over week   Shirley Hamilton 02/16/2013, 9:09 AM

## 2013-02-16 NOTE — Progress Notes (Signed)
Physical Therapy Treatment Patient Details Name: Shirley Hamilton. Malstrom MRN: [id number] DOB: Nov 09, 1935 Today's Date: 02/16/2013 Time: 4098-1191 PT Time Calculation (min): 38 min  PT Assessment / Plan / Recommendation Comments on Treatment Session  Reinforced all back care and precautions.  Emphasized all mobility and focused on breaking bad habit.  Education took longer due to pt not staying on task.    Follow Up Recommendations  Home health PT;Supervision - Intermittent     Does the patient have the potential to tolerate intense rehabilitation     Barriers to Discharge        Equipment Recommendations  None recommended by PT    Recommendations for Other Services    Frequency Min 5X/week   Plan Discharge plan remains appropriate;Frequency remains appropriate    Precautions / Restrictions Precautions Precautions: Back Precaution Comments: pt able to recall 3/3 back precautions Required Braces or Orthoses: Spinal Brace Spinal Brace: Lumbar corset;Applied in sitting position Restrictions Weight Bearing Restrictions: No   Pertinent Vitals/Pain     Mobility  Bed Mobility Bed Mobility: Rolling Left;Rolling Right;Left Sidelying to Sit;Sitting - Scoot to [place] of Bed;Sit to Sidelying Left Rolling Right: 4: Min guard Rolling Left: 4: Min guard Left Sidelying to Sit: 5: Supervision Sitting - Scoot to Edge of Bed: 5: Supervision Sit to Sidelying Left: 4: Min guard Details for Bed Mobility Assistance: reinforced back care/prec. again correcting bad habits Transfers Transfers: Sit to Stand;Stand to Sit Sit to Stand: 5: Supervision Stand to Sit: 5: Supervision Details for Transfer Assistance: reinforced safe technique and corrected for bad habits and impulsivities. Ambulation/Gait Ambulation/Gait Assistance: 5: Supervision Ambulation Distance (Feet): 900 Feet Assistive device: None Ambulation/Gait Assistance Details: generally steady, can scan her environment easily without  incedent. Gait Pattern: Step-through pattern Gait velocity: variable Stairs: No    Exercises     PT Diagnosis:    PT Problem List:   PT Treatment Interventions:     PT Goals Acute Rehab PT Goals Time For Goal Achievement: 02/18/13 Potential to Achieve Goals: Good Pt will Roll Supine to Left Side: with supervision PT Goal: Rolling Supine to Left Side - Progress: Progressing toward goal Pt will go Supine/Side to Sit: with supervision PT Goal: Supine/Side to Sit - Progress: Progressing toward goal Pt will go Sit to Supine/Side: with supervision PT Goal: Sit to Supine/Side - Progress: Met Pt will go Sit to Stand: with supervision PT Goal: Sit to Stand - Progress: Met Pt will Ambulate: >150 feet;with supervision PT Goal: Ambulate - Progress: Met Additional Goals Additional Goal #1: Pt will verbalize and adhere to back precautions. PT Goal: Additional Goal #1 - Progress: Partly met  Visit Information  Last PT Received On: 02/16/13 Assistance Needed: +1    Subjective Data  Subjective: I want you to pick me out a bag (ADL KIT) that is pinkish or green.   Cognition  Cognition Arousal/Alertness: Awake/alert Behavior During Therapy: WFL for tasks assessed/performed Overall Cognitive Status: Within Functional Limits for tasks assessed    Balance  Balance Balance Assessed: No  End of Session PT - End of Session Equipment Utilized During Treatment: Back brace Activity Tolerance: Patient tolerated treatment well Patient left: in chair;with call bell/phone within reach Nurse Communication: Mobility status   GP     Dustin Bumbaugh, Eliseo Gum 02/16/2013, 12:27 PM 02/16/2013  Barataria Bing, PT [phone] (pager)

## 2013-02-16 NOTE — Progress Notes (Signed)
Occupational Therapy Treatment Patient Details Name: Shirley Hamilton MRN: [id number] DOB: [birth year] Today's Date: 02/16/2013 Time: 1345-1400 OT Time Calculation (min): 15 min  OT Assessment / Plan / Recommendation Comments on Treatment Session Pt independently verbalizing back precautions.  Has purchased A/E kit as well as toilet aid.  Limited participation in ADLs due to fatigue but is independently verbalizing correct ADL techniques for maintaining back precautions.    Follow Up Recommendations  Home health OT;Supervision - Intermittent    Barriers to Discharge       Equipment Recommendations  None recommended by OT    Recommendations for Other Services    Frequency Min 2X/week   Plan Discharge plan remains appropriate    Precautions / Restrictions Precautions Precautions: Back Precaution Comments: pt able to recall 3/3 back precautions Required Braces or Orthoses: Spinal Brace Spinal Brace: Lumbar corset;Applied in sitting position Restrictions Weight Bearing Restrictions: No   Pertinent Vitals/Pain See vitals    ADL  Grooming: Performed;Brushing hair;Set up Where Assessed - Grooming: Unsupported sitting ADL Comments: Pt independently verbalizing back precautions and ADL techniques to maintain precautions.  Pt deciding to purchase AE and PT [employer]) had gone down to gift shop to pick up AE kit that pt had purchased. Asked pt if she would like to practice use of AE, but pt declined and reported she knew how to use it. Pt also purchse toilet aid due to concern about being able to perform hygiene after BM without twisting. Pt declining further ADL participation at this time due to recently having returned to bed.     OT Diagnosis:    OT Problem List:   OT Treatment Interventions:     OT Goals ADL Goals Pt Will Perform Grooming: with min assist;Standing at sink ADL Goal: Grooming - Progress: Progressing toward goals  Visit Information  Last OT Received On:  02/16/13 Assistance Needed: +1    Subjective Data      Prior Functioning       Cognition  Cognition Arousal/Alertness: Awake/alert Behavior During Therapy: WFL for tasks assessed/performed Overall Cognitive Status: Within Functional Limits for tasks assessed    Mobility  Bed Mobility Bed Mobility: Rolling Right;Right Sidelying to Sit;Sit to Sidelying Right Rolling Right: 5: Supervision Right Sidelying to Sit: 5: Supervision Sit to Sidelying Right: 5: Supervision Transfers Transfers: Not assessed    Exercises      Balance     End of Session OT - End of Session Equipment Utilized During Treatment: Back brace Activity Tolerance: Patient limited by fatigue Patient left: in bed;with call bell/phone within reach  GO    02/16/2013 Cipriano Mile OTR/L Pager [phone] Office [phone]  Cipriano Mile 02/16/2013, 6:20 PM

## 2013-02-17 DIAGNOSIS — M4316 Spondylolisthesis, lumbar region: Secondary | ICD-10-CM | POA: Diagnosis present

## 2013-02-17 NOTE — Clinical Social Work Psychosocial (Signed)
Clinical Social Work Department BRIEF PSYCHOSOCIAL ASSESSMENT 02/17/2013  Patient:  Shirley Hamilton, Shirley Hamilton.     Account Number:  [id number]     Admit date:  02/14/2013  Clinical Social Worker:  Demetrios Loll  Date/Time:  02/17/2013 03:09 PM  Referred by:  Care Management  Date Referred:  02/17/2013 Referred for  Other - See comment   Other Referral:   From facility   Interview type:  Patient Other interview type:    PSYCHOSOCIAL DATA Living Status:  FACILITY Admitted from facility:  [facility] Level of care:  Independent Living Primary support name:  Pelczar,A J Primary support relationship to patient:  CHILD, ADULT Degree of support available:   adequate    CURRENT CONCERNS Current Concerns  None Noted   Other Concerns:    SOCIAL WORK ASSESSMENT / PLAN CSW met with pt to address consult, as pt was admitted from a facility. CSW introduced herself and explained role of social work. Pt shared that she lives at [facility] in the independent living portion. Pt shared that she will return to her independent living apt at discharge. RNCM has arranged for home health services. Pt shared that she will arrange transportation to facility at discharge. CSW is signing off as no further needs identified. Please reconsult if a need arises prior to discharge.   Assessment/plan status:  Psychosocial Support/Ongoing Assessment of Needs Other assessment/ plan:   Information/referral to community resources:   pt is resident of [facility].    PATIENT'S/FAMILY'S RESPONSE TO PLAN OF CARE: Pt is alert and oriented. Pt is agreeable to discharge plan.   Dede Query, MSW, LCSW [phone]

## 2013-02-17 NOTE — Progress Notes (Signed)
PT Cancellation Note  Patient Details Name: Shirley Hamilton MRN: [id number] DOB: [birth year]   Cancelled Treatment:    Reason Eval/Treat Not Completed: Other (comment) (Pt has already walked today and is sore from incr amb)   Pedrohenrique Mcconville 02/17/2013, 2:37 PM

## 2013-02-17 NOTE — Progress Notes (Signed)
Subjective: Patient reports Feels better today although patient experienced significant leg pain with when trying to walk with longer strides.  Objective: Vital signs in last 24 hours: Temp:  [98.9 F (37.2 C)-100.5 F (38.1 C)] 99.5 F (37.5 C) (05/01 1800) Pulse Rate:  [80-87] 87 (05/01 1800) Resp:  [16-20] 20 (05/01 1800) BP: (104-121)/(42-62) 111/42 mmHg (05/01 1800) SpO2:  [94 %-98 %] 98 % (05/01 1800)  Intake/Output from previous day: 04/30 0701 - 05/01 0700 In: 240 [P.O.:240] Out: -  Intake/Output this shift:    Incision is clean dry on back motor function is good in lower extremities with 4+ out of 5 strength in iliopsoas quads tibialis anterior and gastrocs.  Lab Results:  Recent Labs  02/15/13 0700  WBC 8.4  HGB 11.7*  HCT 34.4*  PLT 184   BMET  Recent Labs  02/15/13 0700  NA 136  K 3.8  CL 100  CO2 30  GLUCOSE 112*  BUN 8  CREATININE 0.65  CALCIUM 8.2*    Studies/Results: No results found.  Assessment/Plan: Stable postop  LOS: 3 days  Plan discharge on Saturday   Shirley Hamilton J 02/17/2013, 7:18 PM

## 2013-02-18 MED ORDER — CLOBETASOL PROPIONATE 0.05 % EX CREA: TOPICAL_CREAM | Freq: Two times a day (BID) | CUTANEOUS | Status: DC

## 2013-02-18 MED ORDER — OXYCODONE HCL 5 MG PO TABS: 5.0000 mg | ORAL_TABLET | ORAL | Status: DC | PRN

## 2013-02-18 MED ORDER — METHOCARBAMOL 500 MG PO TABS: 500.0000 mg | ORAL_TABLET | Freq: Four times a day (QID) | ORAL | Status: DC | PRN

## 2013-02-18 NOTE — Progress Notes (Signed)
Physical Therapy Treatment Patient Details Name: Shirley Hamilton. Guerrieri MRN: [id number] DOB: 06-18-1936 Today's Date: 02/18/2013 Time: 4098-1191 PT Time Calculation (min): 23 min  PT Assessment / Plan / Recommendation Comments on Treatment Session  Pt reports posterior thigh pain began when she walked with a taller nurse and she was trying to keep pace/stride with him. She requested to try a short walk to bathroom at this time and for PT to return later today to take her walking in the hallway.    Follow Up Recommendations  Home health PT;Supervision - Intermittent     Does the patient have the potential to tolerate intense rehabilitation     Barriers to Discharge        Equipment Recommendations  Rolling walker with 5" wheels    Recommendations for Other Services    Frequency Min 5X/week   Plan Discharge plan remains appropriate;Frequency remains appropriate    Precautions / Restrictions Precautions Precautions: Back Precaution Comments: pt able to recall 3/3 back precautions Required Braces or Orthoses: Spinal Brace Spinal Brace: Lumbar corset;Applied in sitting position Restrictions Weight Bearing Restrictions: No   Pertinent Vitals/Pain 10/10 posterior thighs; RN informed and to provide medicine    Mobility  Bed Mobility Bed Mobility: Rolling Left;Left Sidelying to Sit;Sitting - Scoot to Edge of Bed;Sit to Sidelying Left Rolling Left: 5: Supervision Left Sidelying to Sit: 6: Modified independent (Device/Increase time);With rails Sitting - Scoot to Edge of Bed: 6: Modified independent (Device/Increase time) Sit to Sidelying Left: 5: Supervision Details for Bed Mobility Assistance: occasional cues to maintain back precautions (tends to twist with rolling/positioning herself for comfort).  Transfers Transfers: Sit to Stand;Stand to Sit Sit to Stand: 5: Supervision Stand to Sit: 5: Supervision Details for Transfer Assistance: x2; vc to prevent twisting as pt wanted to angle  her knees to her right and twist to put both hands on the bed for support as coming to stand. Ambulation/Gait Ambulation/Gait Assistance: 5: Supervision Ambulation Distance (Feet): 25 Feet Assistive device: None Ambulation/Gait Assistance Details: close guarding due to reports of severe pain in posterior thighs and lack of recent activity. Long discussion re: possible advantages of using a RW during this phase of healing and pt reluctantly allowed me to place a RW in her room to trial with nursing. Gait Pattern: Step-through pattern;Decreased stride length    Exercises     PT Diagnosis:    PT Problem List:   PT Treatment Interventions:     PT Goals Acute Rehab PT Goals Pt will Roll Supine to Left Side: with supervision PT Goal: Rolling Supine to Left Side - Progress: Met Pt will go Supine/Side to Sit: with supervision PT Goal: Supine/Side to Sit - Progress: Partly met Pt will go Sit to Supine/Side: with supervision PT Goal: Sit to Supine/Side - Progress: Partly met Pt will go Sit to Stand: with supervision PT Goal: Sit to Stand - Progress: Partly met Pt will Ambulate: >150 feet;with supervision PT Goal: Ambulate - Progress: Partly met Additional Goals Additional Goal #1: Pt will verbalize and adhere to back precautions. PT Goal: Additional Goal #1 - Progress: Progressing toward goal  Visit Information  Last PT Received On: 02/18/13 Assistance Needed: +1    Subjective Data  Subjective: My legs are still so sore. I know I need to walk, can we coordinate a time with my pain medicine? Patient Stated Goal: go home in 3-5 days   Cognition       Balance  Static Standing Balance Static Standing -  Balance Support: No upper extremity supported Static Standing - Level of Assistance: 7: Independent  End of Session PT - End of Session Equipment Utilized During Treatment: Back brace Activity Tolerance: Patient limited by pain Patient left: in bed;with call bell/phone within  reach;with family/visitor present Nurse Communication: Mobility status;Patient requests pain meds   GP     Francina Beery 02/18/2013, 12:27 PM  02/18/2013 Veda Canning, PT Pager: [phone]

## 2013-02-18 NOTE — Progress Notes (Signed)
Physical Therapy Treatment Patient Details Name: Shirley Hamilton. Shirley Hamilton MRN: [id number] DOB: 11-23-1935 Today's Date: 02/18/2013 Time: 8295-6213 PT Time Calculation (min): 19 min  PT Assessment / Plan / Recommendation Comments on Treatment Session  Pt asking if she can stay until Sunday-- feels her pain is too severe to go home tomorrow. Discussed using pain medicine (and ? muscle relaxers) on regular basis and encouraged pt to discuss her concerns with her doctor and nurse.    Follow Up Recommendations  Home health PT;Supervision - Intermittent     Does the patient have the potential to tolerate intense rehabilitation     Barriers to Discharge        Equipment Recommendations  Rolling walker with 5" wheels    Recommendations for Other Services    Frequency Min 5X/week   Plan Discharge plan remains appropriate;Frequency remains appropriate    Precautions / Restrictions Precautions Precautions: Back Precaution Comments: pt able to recall 3/3 back precautions Required Braces or Orthoses: Spinal Brace Spinal Brace: Lumbar corset;Applied in sitting position   Pertinent Vitals/Pain 9/10 in posterior thighs (after medication); pt wanted to try walking to ease leg pain    Mobility  Bed Mobility Bed Mobility: Rolling Left;Left Sidelying to Sit;Sitting - Scoot to Edge of Bed;Sit to Sidelying Left Rolling Left: 6: Modified independent (Device/Increase time) Left Sidelying to Sit: 6: Modified independent (Device/Increase time);With rails Sitting - Scoot to Edge of Bed: 6: Modified independent (Device/Increase time) Sit to Sidelying Left: 5: Supervision Details for Bed Mobility Assistance: occasional cues to maintain back precautions (tends to twist with rolling/positioning herself for comfort).  Transfers Transfers: Sit to Stand;Stand to Sit Sit to Stand: 5: Supervision Stand to Sit: 5: Supervision Details for Transfer Assistance: vc for safe use of RW Ambulation/Gait Ambulation/Gait  Assistance: 4: Min guard Ambulation Distance (Feet): 150 Feet Assistive device: Rolling walker Ambulation/Gait Assistance Details: instruction in safe use of RW; vc to keep RW closer to her; vc to decr stride length as she reported feeling an uncomfortable pull/stretching; standing rest period x 2; encouraged pt to walk a shorter distance, however she insisted on walking this far Gait Pattern: Step-through pattern;Decreased stride length    PT Goals Acute Rehab PT Goals Pt will Roll Supine to Left Side: with supervision PT Goal: Rolling Supine to Left Side - Progress: Met Pt will go Supine/Side to Sit: with supervision PT Goal: Supine/Side to Sit - Progress: Met Pt will go Sit to Supine/Side: with supervision PT Goal: Sit to Supine/Side - Progress: Partly met Pt will go Sit to Stand: with supervision PT Goal: Sit to Stand - Progress: Progressing toward goal Pt will Ambulate: >150 feet;with supervision PT Goal: Ambulate - Progress: Progressing toward goal Additional Goals Additional Goal #1: Pt will verbalize and adhere to back precautions. PT Goal: Additional Goal #1 - Progress: Partly met  Visit Information  Last PT Received On: 02/18/13 Assistance Needed: +1    Subjective Data  Subjective: I'm starting to feel the medicine work. Let's go Patient Stated Goal: doesn't want to go home too soon; wants to be sure pain controlled   Cognition       Balance  Static Standing Balance Static Standing - Balance Support: No upper extremity supported Static Standing - Level of Assistance: 7: Independent  End of Session PT - End of Session Equipment Utilized During Treatment: Back brace Activity Tolerance: Patient limited by pain Patient left: with call bell/phone within reach;in chair Nurse Communication: Mobility status;Patient requests pain meds   GP  Algis Lehenbauer 02/18/2013, 1:44 PM Pager [phone]'

## 2013-02-18 NOTE — Discharge Summary (Signed)
Physician Discharge Summary  Patient ID: Shirley Hamilton. Menendez MRN: [id number] DOB/AGE: 11-12-1935 77 y.o.  Admit date: 02/14/2013 Discharge date: 02/19/2013  Admission Diagnoses: Lumbar spondylolisthesis L3-L4, lumbar radiculopathy, lumbar stenosis with neurogenic claudication, status post arthrodesis L4-L5  Discharge Diagnoses: Lumbar spondylolisthesis L3-L4, lumbar radiculopathy, lumbar stenosis with neurogenic claudication, status post arthrodesis L4-L5 Principal Problem:   Spondylolisthesis of lumbar region L3-L4   Discharged Condition: good  Hospital Course: Patient was admitted to undergo surgical decompression of L3-L4 where she has spondylolisthesis at developed a degenerative fashion. Years ago the patient had decompression and arthrodesis at L4-L5 where she had a degenerative listhesis also. The patient tolerated surgery well is having some modest residual leg pain her incision is clean and dry and she is ambulatory.  Consults: None  Significant Diagnostic Studies: None  Treatments: surgery: Laminectomy decompression L3-L4 decompression of L3 and L4 nerve roots posterior lumbar interbody arthrodesis with peek spacers pedicle screw fixation L3-L4. Posterior lateral arthrodesis with local autograft and allograft. Removal of previously placed hardware at L4-L5  Discharge Exam: Blood pressure 97/53, pulse 72, temperature 98.1 F (36.7 C), temperature source Oral, resp. rate 16, height 5\' 5"  (1.651 m), weight 72.848 kg (160 lb 9.6 oz), SpO2 97.00%. Incision is clean and dry motor function is good in iliopsoas quadriceps tibialis anterior and gastrocs.  Disposition: 01-Home or Self Care  Discharge Orders   Future Appointments Provider Department Dept Phone   03/07/2013 11:00 AM Huston Foley, MD [facility] [phone]   03/09/2013 2:30 PM Su Monks, PA-C [facility] [phone]   Future Orders Complete By Expires     Call  MD for:  redness, tenderness, or signs of infection (pain, swelling, redness, odor or green/yellow discharge around incision site)  As directed     Call MD for:  severe uncontrolled pain  As directed     Call MD for:  temperature >100.4  As directed     Diet - low sodium heart healthy  As directed     Discharge instructions  As directed     Comments:      Okay to shower. Do not apply salves or appointments to incision. No heavy lifting with the upper extremities greater than 15 pounds. May resume driving when not requiring pain medication and patient feels comfortable with doing so.    Increase activity slowly  As directed         Medication List    TAKE these medications       BIOTIN 5000 PO  Take 5,000 mcg by mouth daily.     clobetasol cream 0.05 %  Commonly known as:  TEMOVATE  Apply topically 2 (two) times daily.     diazepam 5 MG tablet  Commonly known as:  VALIUM  Take 1 tablet (5 mg total) by mouth 2 (two) times daily.     DULoxetine 30 MG capsule  Commonly known as:  CYMBALTA  Take 30 mg by mouth at bedtime.     methocarbamol 500 MG tablet  Commonly known as:  ROBAXIN  Take 1 tablet (500 mg total) by mouth 3 (three) times daily. Must last 30 days.     methocarbamol 500 MG tablet  Commonly known as:  ROBAXIN  Take 1 tablet (500 mg total) by mouth every 6 (six) hours as needed.     multivitamin with minerals Tabs  Take 1 tablet by mouth daily.     oxycodone 5 MG capsule  Commonly known as:  OXY-IR  Take 1 capsule (5 mg total) by mouth 4 (four) times daily.     oxyCODONE 5 MG immediate release tablet  Commonly known as:  Oxy IR/ROXICODONE  Take 1 tablet (5 mg total) by mouth every 4 (four) hours as needed for pain.     PHILLIPS 500 MG (LAX) Tabs  Generic drug:  Magnesium Oxide  Take 2 tablets by mouth daily as needed (constipation).     SUMAtriptan 100 MG tablet  Commonly known as:  IMITREX     Turmeric 450 MG Caps  Take 450 mg by mouth daily.      vitamin B-12 1000 MCG tablet  Commonly known as:  CYANOCOBALAMIN  Take 1,000 mcg by mouth daily.     vitamin C 1000 MG tablet  Take 1,000 mg by mouth daily.     zolpidem 10 MG tablet  Commonly known as:  AMBIEN  Take 10 mg by mouth at bedtime as needed for sleep.           Follow-up Information   Follow up with [facility]. [facility])    Contact information:   [phone]      Signed: Stefani Dama 02/18/2013, 4:54 PM

## 2013-02-18 NOTE — Progress Notes (Signed)
Patient ID: Shirley Hamilton. Fidalgo, female   DOB: 1936-06-12, 77 y.o.   MRN: [id number] And stable motor function is intact incision is clean and dry patient may shower pretty for discharge tomorrow will write orders.

## 2013-02-18 NOTE — Progress Notes (Signed)
Occupational Therapy Treatment Patient Details Name: Shirley Hamilton. Braggs MRN: [id number] DOB: [birth year] Today's Date: 02/18/2013 Time: 4098-1191 OT Time Calculation (min): 38 min  OT Assessment / Plan / Recommendation Comments on Treatment Session Pt making progress and is able to recall all back pecautions, has purchased ADL A/E kit and toileitng aid for use at home. Pt able to verbalize use of A/E and return demo for LB ADLs. Pt to continue with acute OT services to maximize level of function and safety    Follow Up Recommendations  Home health OT;Supervision - Intermittent    Barriers to Discharge   None    Equipment Recommendations  None recommended by OT    Recommendations for Other Services    Frequency Min 2X/week   Plan Discharge plan remains appropriate    Precautions / Restrictions Precautions Precautions: Back Precaution Comments: pt able to recall 3/3 back precautions Required Braces or Orthoses: Spinal Brace Spinal Brace: Lumbar corset;Applied in sitting position Restrictions Weight Bearing Restrictions: No   Pertinent Vitals/Pain 3/10 back    ADL  Grooming: Performed;Supervision/safety;Wash/dry face;Brushing hair;Wash/dry hands Where Assessed - Grooming: Unsupported standing Lower Body Bathing: Simulated;Moderate assistance;Minimal assistance Where Assessed - Lower Body Bathing: Unsupported sitting;Supported sit to stand Lower Body Dressing: Moderate assistance;Performed Toilet Transfer: Research scientist (life sciences) Method: Sit to Barista: Regular height toilet;Grab bars Toileting - Architect and Hygiene: Min guard;Performed Where Assessed - Engineer, mining and Hygiene: Standing ADL Comments: pt has purchased ADL A/E kit and is able to verbalize and demo correct use, pt also has toileting aid, but declined use during toileting tasks    OT Diagnosis:    OT Problem List:   OT Treatment  Interventions:     OT Goals ADL Goals ADL Goal: Grooming - Progress: Progressing toward goals ADL Goal: Lower Body Bathing - Progress: Progressing toward goals ADL Goal: Lower Body Dressing - Progress: Progressing toward goals ADL Goal: Toilet Transfer - Progress: Progressing toward goals ADL Goal: Toileting - Clothing Manipulation - Progress: Progressing toward goals ADL Goal: Toileting - Hygiene - Progress: Progressing toward goals  Visit Information  Last OT Received On: 02/18/13 Assistance Needed: +1    Subjective Data  Subjective: " I feel much better this afternoon " Patient Stated Goal: To return home   Prior Functioning   Independent   Cognition  Cognition Arousal/Alertness: Awake/alert Behavior During Therapy: WFL for tasks assessed/performed Overall Cognitive Status: Within Functional Limits for tasks assessed    Mobility  Bed Mobility Bed Mobility: Sit to Sidelying Left;Scooting to [facility] Rolling Left: 6: Modified independent (Device/Increase time) Left Sidelying to Sit: 6: Modified independent (Device/Increase time);With rails Sitting - Scoot to Edge of Bed: 6: Modified independent (Device/Increase time) Sit to Sidelying Left: 5: Supervision Scooting to [facility]: 5: Supervision Details for Bed Mobility Assistance: occasional cues to maintain back precautions (tends to twist with rolling/positioning herself for comfort).  Transfers Transfers: Sit to Stand;Stand to Sit Sit to Stand: 5: Supervision;Without upper extremity assist;From chair/3-in-1;From toilet Stand to Sit: 5: Supervision;Without upper extremity assist;To bed;To chair/3-in-1;To toilet Details for Transfer Assistance: vc for safe use of RW          Balance Balance Balance Assessed: No Static Standing Balance Static Standing - Balance Support: No upper extremity supported Static Standing - Level of Assistance: 7: Independent   End of Session OT - End of Session Equipment Utilized During Treatment:  Gait belt;Other (comment);Back brace (RW, ADL A/E) Activity Tolerance: Patient tolerated treatment well Patient left:  in bed;with call bell/phone within reach  GO     Galen Manila 02/18/2013, 3:30 PM

## 2013-02-18 NOTE — Progress Notes (Signed)
[facility]  Patient Status: New  [facility] is providing the following services: PT and OT - note plan for possible discharge tomorrow. Is on the schedule for a PT visit at home on Saturday or Sunday.  Thank you.  If patient discharges after hours, please call [phone].   Jodene Nam 02/18/2013, 4:02 PM

## 2013-02-19 NOTE — Progress Notes (Signed)
Occupational Therapy Treatment Patient Details Name: Shirley Hamilton. Dejonge MRN: [id number] DOB: Sep 10, 1936 Today's Date: 02/19/2013 Time: 4098-1191 OT Time Calculation (min): 40 min  OT Assessment / Plan / Recommendation Comments on Treatment Session pt. making excellent progress.  mod i all aspects of toileting and with bed mobility.  s for shower stall transer with ledge.  has a/e kit and shower chair at h ome.      Follow Up Recommendations  Supervision - Intermittent;Home health OT                    Frequency Min 2X/week   Plan Discharge plan remains appropriate    Precautions / Restrictions Precautions Precautions: Back Precaution Comments: able to recal 3/3 precautions, and demonstrate throughout session Required Braces or Orthoses: Spinal Brace Spinal Brace: Lumbar corset;Applied in sitting position Restrictions Weight Bearing Restrictions: No   Pertinent Vitals/Pain 8/10, repositioned    ADL  Grooming: Wash/dry hands;Supervision/safety Where Assessed - Grooming: Unsupported standing Toilet Transfer: Performed;Modified independent Toilet Transfer Method: Sit to Barista: Regular height toilet;Grab bars Toileting - Clothing Manipulation and Hygiene: Performed;Modified independent Where Assessed - Toileting Clothing Manipulation and Hygiene: Sit to stand from 3-in-1 or toilet Tub/Shower Transfer: Performed;Supervision/safety Tub/Shower Transfer Method: Science writer: Grab bars;Walk in shower Equipment Used: Back brace Transfers/Ambulation Related to ADLs: pt. amb. without walker, states "i will not be using this and i dont need it", no lob noted. amb. to/from b.room, min. furniture walking with good balance ADL Comments: has a/e kit, declines practice today, able to don brace with set up.  performed walk-in shower transfer with s, all aspects with sim. ledge and intermittent use of grab bars.  has shower chair at home.  all  aspects of toileting mod. i.         OT Goals ADL Goals ADL Goal: Grooming - Progress: Met ADL Goal: Toilet Transfer - Progress: Met ADL Goal: Toileting - Clothing Manipulation - Progress: Met ADL Goal: Toileting - Hygiene - Progress: Met ADL Goal: Tub/Shower Transfer - Progress: Met  Visit Information  Last OT Received On: 02/19/13    Subjective Data  Subjective: "let me show you i have to be able to do this by myself" Patient Stated Goal: "i'm going back to independent living"         Cognition  Cognition Arousal/Alertness: Awake/alert Behavior During Therapy: WFL for tasks assessed/performed Overall Cognitive Status: Within Functional Limits for tasks assessed    Mobility  Bed Mobility Bed Mobility: Supine to Sit;Left Sidelying to Sit Rolling Left: 6: Modified independent (Device/Increase time) Left Sidelying to Sit: 6: Modified independent (Device/Increase time);HOB flat Supine to Sit: 6: Modified independent (Device/Increase time) Sit to Sidelying Left: 6: Modified independent (Device/Increase time);HOB flat Details for Bed Mobility Assistance: mod i all aspects with hob flat and removal of rails to simulate home environment.   Transfers Transfers: Sit to Stand;Stand to Sit Sit to Stand: 6: Modified independent (Device/Increase time);From toilet;From chair/3-in-1;From bed;With armrests;With upper extremity assist Stand to Sit: 6: Modified independent (Device/Increase time);With upper extremity assist;To chair/3-in-1;To bed;To toilet              End of Session OT - End of Session Equipment Utilized During Treatment: Back brace Activity Tolerance: Patient tolerated treatment well Patient left: in bed;with call bell/phone within reach       Robet Leu 02/19/2013, 9:32 AM

## 2013-02-19 NOTE — Progress Notes (Signed)
Pt void twice today, but had some trouble to void in the evening. Bladder scan showed 436 ml and through straight cath we emptied 700 ml of urine. Pt is comfortable now, will keep monitoring.

## 2013-02-19 NOTE — Progress Notes (Signed)
Subjective: Patient reports Overall she's okay her leg pain is significantly improved although she had some difficulty voiding overnight had to be catheterized  Objective: Vital signs in last 24 hours: Temp:  [97.9 F (36.6 C)-98.4 F (36.9 C)] 97.9 F (36.6 C) (05/03 0606) Pulse Rate:  [70-72] 70 (05/03 0606) Resp:  [16-20] 20 (05/03 0606) BP: (97-135)/(44-66) 102/44 mmHg (05/03 0606) SpO2:  [97 %-100 %] 98 % (05/03 0606)  Intake/Output from previous day: 05/02 0701 - 05/03 0700 In: 480 [P.O.:480] Out: 451 [Urine:450; Stool:1] Intake/Output this shift:    Strength out of 5 wound clean and dry  Lab Results: No results found for this basename: WBC, HGB, HCT, PLT,  in the last 72 hours BMET No results found for this basename: NA, K, CL, CO2, GLUCOSE, BUN, CREATININE, CALCIUM,  in the last 72 hours  Studies/Results: No results found.  Assessment/Plan: We'll hold discharge 1 more day keep working on her bladder function probable discharge tomorrow   LOS: 5 days     Evola Hollis P 02/19/2013, 9:18 AM

## 2013-02-20 NOTE — Progress Notes (Signed)
Patient ID: Shirley Hamilton. [name], female   DOB: Sep 23, 1936, 77 y.o.   MRN: [id number] As well as a little better today she is now voiding as of this morning she got to be catheterized a couple times last night her pain is better controlled she still has some concerns about going home she is going to consider that I gave her the option. Neurologically she stable back and leg pain well-controlled the only issues now really are bladder.

## 2013-02-20 NOTE — Progress Notes (Signed)
Clinical Social Work Department CLINICAL SOCIAL WORK PLACEMENT NOTE 02/20/2013  Patient:  Shirley Hamilton, Shirley Hamilton.  Account Number:  [id number] Admit date:  02/14/2013  Clinical Social Worker:  Reece Levy, Theresia Majors  Date/time:  02/20/2013 01:55 PM  Clinical Social Work is seeking post-discharge placement for this patient at the following level of care:   SKILLED NURSING   (*CSW will update this form in Epic as items are completed)     Patient/family provided with [facility] Department of Clinical Social Work's list of facilities offering this level of care within the geographic area requested by the patient (or if unable, by the patient's family).  02/20/2013  Patient/family informed of their freedom to choose among providers that offer the needed level of care, that participate in Medicare, Medicaid or managed care program needed by the patient, have an available bed and are willing to accept the patient.  02/20/2013  Patient/family informed of [facility]' ownership interest in [facility], as well as of the fact that they are under no obligation to receive care at this facility.  PASARR submitted to EDS on 02/20/2013 PASARR number received from EDS on 02/20/2013  FL2 transmitted to all facilities in geographic area requested by pt/family on  02/20/2013 FL2 transmitted to all facilities within larger geographic area on   Patient informed that his/her managed care company has contracts with or will negotiate with  certain facilities, including the following:   [facility] Medicare auth discussed     Patient/family informed of bed offers received:   Patient chooses bed at  Physician recommends and patient chooses bed at    Patient to be transferred to  on   Patient to be transferred to facility by   The following physician request were entered in Epic:   Additional Comments:Garret Teale [place], MSW, [employer] Weekend coverage [phone]

## 2013-02-20 NOTE — Progress Notes (Signed)
Met with patient and her husband (Dr. Rolene Course) along with 2 daughters to discuss possible SNF- per their report, patient may benefit from a ST SNF stay before returning to [facility] with her husband who has some mobility issues. Discussed the need for SNF auth from [facility] Medicare to ensure coverage and this will have to be determined Monday- They understand and are hopeful for SNF bed at [facility]- I have left a message for [facility] rep to help facilitate a possible approval and transfer Monday.  Reece Levy, MSW, LCSWA Weekend coverage [phone]

## 2013-02-21 NOTE — Clinical Social Work Note (Signed)
Clinical Social Work   CSW met with pt to address discharge plan. Pt does not qualify for SNF placement. Pt is agreeable to discharging home with home health services. CSW will update RNCM and RN. CSW is signing off as no further needs identified.   Dede Query, MSW, LCSW [phone]

## 2013-02-21 NOTE — Progress Notes (Signed)
Physical Therapy Treatment Patient Details Name: Shirley Hamilton. Dilworth MRN: [id number] DOB: [birth year] Today's Date: 02/21/2013 Time: 7829-5621 PT Time Calculation (min): 28 min  PT Assessment / Plan / Recommendation Comments on Treatment Session  Pt is feeling much better today. Pt was highly talkative and distracted at times, but was able to safely demonstrate good technique with mobility. Pt is cleared to walk independently in her room without an assistive device. Pt is also safe to walk in the hall without assistance.  Recommend d/c to her independently living apartment as pt is independent and does not need SNF. Pt is requesting HHPT follow-up. Pt is d/c from acute PT services as all goals have been met.    Follow Up Recommendations  Home health PT     Does the patient have the potential to tolerate intense rehabilitation     Barriers to Discharge        Equipment Recommendations  None recommended by PT    Recommendations for Other Services    Frequency     Plan All goals met and education completed, patient dischaged from PT services    Precautions / Restrictions Precautions Precautions: Back Precaution Comments: Independently recalled 3/3 back precautions Required Braces or Orthoses: Spinal Brace Spinal Brace: Lumbar corset   Pertinent Vitals/Pain     Mobility  Bed Mobility Bed Mobility: Left Sidelying to Sit;Sitting - Scoot to Edge of Bed Rolling Left: 7: Independent Left Sidelying to Sit: 7: Independent Sitting - Scoot to Edge of Bed: 7: Independent Transfers Transfers: Sit to Stand;Stand to Sit Sit to Stand: 7: Independent Stand to Sit: 7: Independent Details for Transfer Assistance: good, safe technique Ambulation/Gait Ambulation/Gait Assistance: 7: Independent Ambulation Distance (Feet): 150 Feet Assistive device: None Ambulation/Gait Assistance Details: Independent on level surfaces, Pt very talkative and distracted at times, but did not demonstrate any giat  instability. Gait Pattern: Within Functional Limits Gait velocity: good Stairs: No    Exercises     PT Diagnosis:    PT Problem List:   PT Treatment Interventions:     PT Goals Acute Rehab PT Goals PT Goal: Sit to Supine/Side - Progress: Met PT Goal: Sit to Stand - Progress: Met PT Goal: Ambulate - Progress: Met Additional Goals PT Goal: Additional Goal #1 - Progress: Met  Visit Information  Last PT Received On: 02/21/13    Subjective Data  Subjective: I feel like I am better today and can go home.   Cognition  Cognition Arousal/Alertness: Awake/alert Behavior During Therapy: WFL for tasks assessed/performed Overall Cognitive Status: Within Functional Limits for tasks assessed    Balance  Balance Balance Assessed: Yes Static Standing Balance Static Standing - Balance Support: No upper extremity supported Static Standing - Level of Assistance: 7: Independent Dynamic Standing Balance Dynamic Standing - Balance Support: No upper extremity supported Dynamic Standing - Level of Assistance: 7: Independent High Level Balance High Level Balance Activites: Head turns  End of Session PT - End of Session Equipment Utilized During Treatment: Back brace Activity Tolerance: Patient tolerated treatment well Patient left: in chair;with call bell/phone within reach Nurse Communication: Other (comment) (Pt is cleared to walk in room without assistance.)   GP     Greggory Stallion 02/21/2013, 8:34 AM

## 2013-02-21 NOTE — Progress Notes (Signed)
Patient ID: Shirley Hamilton. Lamke, female   DOB: 12-20-1935, 77 y.o.   MRN: [id number] Events of this weekend include urinary retention which required catheterization on Saturday. Question of patient's need for assistance postoperatively was addressed by consideration of skilled nursing facility. After careful consideration however Ms. Rennaker feels that she can get by without and with the use of a skilled nursing facility and wishes to be discharged home. Is discharged home today. His been voiding well.

## 2013-02-21 NOTE — Discharge Summary (Signed)
Physician Discharge Summary  Patient ID: Shirley Hamilton. Tahir MRN: [id number] DOB/AGE: 1936-08-07 77 y.o.  Admit date: 02/14/2013 Discharge date: 02/21/2013  Admission Diagnoses: Lumbar spondylosis and stenosis L3-L4 status post arthrodesis L4-L5  Discharge Diagnoses: Lumbar spondylosis and stenosis L3-L4, lumbar radiculopathy, status post arthrodesis L4-L5. Urinary retention. Acute blood loss anemia.  Principal Problem:   Spondylolisthesis of lumbar region L3-L4   Discharged Condition: good  Hospital Course: Patient was admitted for decompression and stabilization of the severe spondylosis with stenosis at L3-L4. She had had a previous fusion at L4-L5 area patient was experiencing severe radicular pain and L3 and L4 distribution. Her gait was severely limited. She was advised regarding surgery. She underwent decompression arthrodesis at L3-L4. During the hospital stay she had some residual pain in the legs and the back though much of her preoperative pain felt improved. She did experience urinary retention this required placement of a catheter intermittently until her bladder function resumed. She also had some acute blood loss anemia secondary to surgical intervention the postoperative hemoglobin of 11.7. Her preoperative hemoglobin was 15.5.  Consults: None  Significant Diagnostic Studies: None  Treatments: surgery: Decompression of L3-L4 decompression of L3 and L4 nerve roots, posterior lumbar interbody arthrodesis L3-L4, pedicle screw fixation L3-L4. Removal of previous hardware L4-L5  Discharge Exam: Blood pressure 104/59, pulse 84, temperature 97.9 F (36.6 C), temperature source Oral, resp. rate 17, height 5\' 5"  (1.651 m), weight 72.848 kg (160 lb 9.6 oz), SpO2 98.00%. incision is clean and dry. Motor function is good in iliopsoas quadriceps tibialis anterior and gastrocs. Station and gait are intact.  Disposition: 01-Home or Self Care  Discharge Orders   Future Appointments  Provider Department Dept Phone   03/07/2013 11:00 AM Huston Foley, MD [facility] [phone]   03/09/2013 2:30 PM Su Monks, PA-C [facility] [phone]   Future Orders Complete By Expires     Call MD for:  redness, tenderness, or signs of infection (pain, swelling, redness, odor or green/yellow discharge around incision site)  As directed     Call MD for:  severe uncontrolled pain  As directed     Call MD for:  temperature >100.4  As directed     Diet - low sodium heart healthy  As directed     Diet - low sodium heart healthy  As directed     Discharge instructions  As directed     Comments:      Okay to shower. Do not apply salves or appointments to incision. No heavy lifting with the upper extremities greater than 15 pounds. May resume driving when not requiring pain medication and patient feels comfortable with doing so.    Increase activity slowly  As directed     Increase activity slowly  As directed         Medication List    TAKE these medications       BIOTIN 5000 PO  Take 5,000 mcg by mouth daily.     clobetasol cream 0.05 %  Commonly known as:  TEMOVATE  Apply topically 2 (two) times daily.     diazepam 5 MG tablet  Commonly known as:  VALIUM  Take 1 tablet (5 mg total) by mouth 2 (two) times daily.     DULoxetine 30 MG capsule  Commonly known as:  CYMBALTA  Take 30 mg by mouth at bedtime.     methocarbamol 500 MG tablet  Commonly known as:  ROBAXIN  Take 1  tablet (500 mg total) by mouth 3 (three) times daily. Must last 30 days.     methocarbamol 500 MG tablet  Commonly known as:  ROBAXIN  Take 1 tablet (500 mg total) by mouth every 6 (six) hours as needed.     multivitamin with minerals Tabs  Take 1 tablet by mouth daily.     oxycodone 5 MG capsule  Commonly known as:  OXY-IR  Take 1 capsule (5 mg total) by mouth 4 (four) times daily.     oxyCODONE 5 MG immediate release tablet  Commonly known  as:  Oxy IR/ROXICODONE  Take 1 tablet (5 mg total) by mouth every 4 (four) hours as needed for pain.     PHILLIPS 500 MG (LAX) Tabs  Generic drug:  Magnesium Oxide  Take 2 tablets by mouth daily as needed (constipation).     SUMAtriptan 100 MG tablet  Commonly known as:  IMITREX     Turmeric 450 MG Caps  Take 450 mg by mouth daily.     vitamin B-12 1000 MCG tablet  Commonly known as:  CYANOCOBALAMIN  Take 1,000 mcg by mouth daily.     vitamin C 1000 MG tablet  Take 1,000 mg by mouth daily.     zolpidem 10 MG tablet  Commonly known as:  AMBIEN  Take 10 mg by mouth at bedtime as needed for sleep.           Follow-up Information   Follow up with [facility]. [facility])    Contact information:   [phone]      Signed: Stefani Dama 02/21/2013, 12:24 PM

## 2013-02-21 NOTE — Progress Notes (Signed)
Physical Therapy Discharge Patient Details Name: Shirley Hamilton. Buntin MRN: [id number] DOB: 1936/05/28 Today's Date: 02/21/2013 Time: 7829-5621 PT Time Calculation (min): 28 min  Patient discharged from PT services secondary to goals met and no further PT needs identified.  Please see latest therapy progress note for current level of functioning and progress toward goals.    Progress and discharge plan discussed with patient and/or caregiver: Patient/Caregiver agrees with plan  GP     Greggory Stallion 02/21/2013, 8:36 AM

## 2013-03-07 ENCOUNTER — Ambulatory Visit (INDEPENDENT_AMBULATORY_CARE_PROVIDER_SITE_OTHER): Payer: Medicare Other | Admitting: Neurology

## 2013-03-07 ENCOUNTER — Encounter: Payer: Self-pay | Admitting: Neurology

## 2013-03-07 VITALS — BP 105/67 | HR 88 | Temp 99.1°F | Ht 63.0 in | Wt 154.0 lb

## 2013-03-07 DIAGNOSIS — G43909 Migraine, unspecified, not intractable, without status migrainosus: Secondary | ICD-10-CM

## 2013-03-07 NOTE — Patient Instructions (Addendum)
I think you are doing fairly well and can follow up with your primary care physician.    Remember to drink plenty of fluid, eat healthy meals and do not skip any meals. Try to eat protein with a every meal and eat a healthy snack such as fruit or nuts in between meals. Try to keep a regular sleep-wake schedule and try to exercise daily, particularly in the form of walking, 20-30 minutes a day, if you can.   Please remember, common headache triggers are: sleep deprivation, dehydration, overheating, stress, hypoglycemia or skipping meals, excessive pain medications or excessive alcohol use. Some people have food triggers such as aged cheese, orange juice or chocolate, especially dark chocolate. Try to avoid these headache triggers as much possible. It may be helpful to keep a headache diary to figure out what makes your headaches worse or brings them on. Some people report headache onset after exercise but studies have shown that regular exercise may actually prevent headaches from coming on. If you have exercise-induced headaches, please make sure that you drink plenty of fluid before and after exercising and that you don't over do it.  As far as your medications are concerned, I would like to suggest no changes. Use imitrex as needed. Your primary care physician may be able to prescribe it.

## 2013-03-07 NOTE — Progress Notes (Signed)
Subjective:    Patient ID: Shirley Hamilton. Flannigan is a 77 y.o. female.  HPI  Interim history:   Shirley Hamilton is a very pleasant 77 year old right-handed woman who presents for followup consultation of her migraine headaches. The patient is unaccompanied today. This is her first visit with me and she previously followed with Dr. Fayrene Fearing love and was last seen on 08/31/2012 at which time he felt that she was doing well with her current medication regimen.  She has an underlying medical history of chronic neck and back pain for which she is under pain management. She has a history of cervical laminectomy, hyperlipidemia, skin disease and vitamin D deficiency. Her current medications are vitamin D, Lexapro, vitamin B6, B12, biotin, gabapentin 600 mg 3 times a day, Imitrex as needed, Premarin, Ambien at night, ginger root, turmeric, coenzyme Q 10, vitamin C, multivitamin, probiotic, Dramamine, oxycodone 5 mg 4 times a day per pain management, furosemide, diazepam, cyclobenzaprine.  Review Dr. Imagene Gurney prior notes and the patient's records and below is a summary of that review:  78 year old right-handed woman with a history of headaches that started in childhood around age 38. She has mostly left-sided headaches. She also describes left periorbital and temporal pain. Her headaches are associated with photophobia and sonophobia. She is a strong family history of migraine headaches. On Inderal she developed hallucinations and fatigue. She has been on Valium, Percodan, peroneal, Cafergot, Midrin and Reglan as well as Bellergal and Ergostat. She's currently on Imitrex 100 mg strength and takes approximately once per month. She may have taken Topamax in the past. Dr. love reported that she does not like to take preventatives. She has not tried IV Depacon. In October 2000 and she head CT that showed mild atrophy. She is followed a pain management doctor for her chronic back pain and is on oxycodone 5 mg 4 times a day. She has  also received epidural steroid injections. She had a positive ANA in the past.  In addition, she was admitted to the hospital on 02/14/2013 and discharged on 02/21/2013 for back surgery. She was admitted for decompression and stabilization of severe spondylosis with stenosis at L3-L4. She has had a previous fusion at L4-L5 area patient was experiencing severe radicular pain and L3 and L4 distribution with limitation of her gait. She underwent decompression arthrodesis at L3-L4. She had urinary retention this required placement of a catheter intermittently.   She underwent decompression of L3-L4 decompression of L3 and L4 nerve roots, posterior lumbar interbody arthrodesis L3-L4, pedicle screw fixation L3-L4. Removal of previous hardware at L4-L5.   She has been having issues with depression and has in the past been tried on Pristiq, Lexapro and Cymbalt, all with SEs. She recently moved in with her husband to [facility].   Her Past Medical History Is Significant For: Past Medical History  Diagnosis Date  . Neuromuscular disorder   . Anxiety   . Mental disorder   . Arthritis   . Headache     Her Past Surgical History Is Significant For: Past Surgical History  Procedure Laterality Date  . Spine surgery    . Knee surgery      right    Her Family History Is Significant For: No family history on file.  Her Social History Is Significant For: History   Social History  . Marital Status: Married    Spouse Name: N/A    Number of Children: N/A  . Years of Education: N/A   Social History Main  Topics  . Smoking status: Never Smoker   . Smokeless tobacco: Never Used  . Alcohol Use: Yes     Comment: socially  . Drug Use: No  . Sexually Active: None   Other Topics Concern  . None   Social History Narrative  . None    Her Allergies Are:  Allergies  Allergen Reactions  . Morphine And Related Other (See Comments)    Migraines  . Erythrocin   . Erythromycin   :   Her  Current Medications Are:  Outpatient Encounter Prescriptions as of 03/07/2013  Medication Sig Dispense Refill  . Ascorbic Acid (VITAMIN C) 1000 MG tablet Take 1,000 mg by mouth daily.      Marland Kitchen BIOTIN 5000 PO Take 5,000 mcg by mouth daily.       . clobetasol cream (TEMOVATE) 0.05 % Apply topically 2 (two) times daily.      . diazepam (VALIUM) 5 MG tablet Take 1 tablet (5 mg total) by mouth 2 (two) times daily.  60 tablet  1  . Magnesium Oxide (PHILLIPS) 500 MG (LAX) TABS Take 2 tablets by mouth daily as needed (constipation).       . methocarbamol (ROBAXIN) 500 MG tablet Take 1 tablet (500 mg total) by mouth every 6 (six) hours as needed.  40 tablet  3  . Multiple Vitamin (MULTIVITAMIN WITH MINERALS) TABS Take 1 tablet by mouth daily.      Marland Kitchen oxyCODONE (OXY IR/ROXICODONE) 5 MG immediate release tablet Take 1 tablet (5 mg total) by mouth every 4 (four) hours as needed for pain.  60 tablet  0  . SUMAtriptan (IMITREX) 100 MG tablet       . Turmeric 450 MG CAPS Take 450 mg by mouth daily.      . vitamin B-12 (CYANOCOBALAMIN) 1000 MCG tablet Take 1,000 mcg by mouth daily.      Marland Kitchen zolpidem (AMBIEN) 10 MG tablet Take 10 mg by mouth at bedtime as needed for sleep.       . DULoxetine (CYMBALTA) 30 MG capsule Take 30 mg by mouth at bedtime.      . [DISCONTINUED] methocarbamol (ROBAXIN) 500 MG tablet Take 1 tablet (500 mg total) by mouth 3 (three) times daily. Must last 30 days.  90 tablet  2  . [DISCONTINUED] oxycodone (OXY-IR) 5 MG capsule Take 1 capsule (5 mg total) by mouth 4 (four) times daily.  120 capsule  0   No facility-administered encounter medications on file as of 03/07/2013.   Review of Systems  Neurological: Positive for headaches.    Objective:  Neurologic Exam  Physical Exam Physical Examination:   Filed Vitals:   03/07/13 1114  BP: 105/67  Pulse: 88  Temp: 99.1 F (37.3 C)    General Examination: The patient is a very pleasant 77 y.o. female in no acute distress. She appears  well-developed and well-nourished and very well groomed.   HEENT: Normocephalic, atraumatic, pupils are equal, round and reactive to light and accommodation. Funduscopic exam is normal with sharp disc margins noted. Extraocular tracking is good without limitation to gaze excursion or nystagmus noted. Normal smooth pursuit is noted. Hearing is grossly intact. Tympanic membranes are clear bilaterally. Face is symmetric with normal facial animation and normal facial sensation. Speech is clear with no dysarthria noted. There is no hypophonia. There is no lip, neck/head, jaw or voice tremor. Neck is supple with full range of passive and active motion. There are no carotid bruits on auscultation. Oropharynx exam  reveals: good dental hygiene and no sig airway crowding. Mallampati is class II. Tongue protrudes centrally and palate elevates symmetrically.    Chest: Clear to auscultation without wheezing, rhonchi or crackles noted.  Heart: S1+S2+0, regular and normal without murmurs, rubs or gallops noted.   Abdomen: Soft, non-tender and non-distended with normal bowel sounds appreciated on auscultation.  Extremities: There is 1+ pitting edema in the distal lower extremities bilaterally around the ankles.   Skin: Warm and dry without trophic changes noted. There are no varicose veins.  Musculoskeletal: exam reveals no obvious joint deformities, tenderness or joint swelling or erythema. She is wearing a removable soft brace, which she took off for the exam.   Neurologically:  Mental status: The patient is awake, alert and oriented in all 4 spheres. Her memory, attention, language and knowledge are appropriate. There is no aphasia, agnosia, apraxia or anomia. Speech is clear with normal prosody and enunciation. Thought process is linear. Mood is congruent and affect is normal.  Cranial nerves are as described above under HEENT exam. In addition, shoulder shrug is normal with equal shoulder height noted. Motor  exam: Normal bulk, strength and tone is noted. There is no drift, tremor or rebound. Romberg is negative. Reflexes are 2+ throughout. Fine motor skills are intact with normal finger taps, normal hand movements, normal rapid alternating patting, normal foot taps and normal foot agility.  Cerebellar testing shows no dysmetria or intention tremor on finger to nose testing. Heel to shin is unremarkable bilaterally. There is no truncal or gait ataxia.  Sensory exam is intact to light touch, pinprick, vibration, temperature sense and proprioception in the upper and lower extremities.  Gait, station and balance are unremarkable, with the exception of slightly cautious gait. No veering to one side is noted. No leaning to one side is noted. Posture is age-appropriate and stance is narrow based. No problems turning are noted. She turns en bloc.   Assessment and Plan:   In summary, Shirley Hamilton is a very pleasant 77 y.o.-year old female with a history of migraine HAs. Her physical exam is fairly stable with the exception of mildly cautious. As far as her migraine goes, she has remained stable and has been using her Imitrex as needed. She is doing fairly well at this time and I reassured the patient in that regard.  I had a long chat with the patient about my findings and the diagnosis of migraine HAs, the prognosis and treatment options. We talked about medical treatments and non-pharmacological approaches. We talked about trying to maintaining a healthy lifestyle in general. I encouraged the patient to eat healthy, exercise daily and keep well hydrated, to keep a scheduled bedtime and wake time routine, to not skip any meals. I told her about common HA triggers as well.  As far as medications are concerned, I recommended the following at this time: no change. I do recommend, that she follow up with her PCP regarding Imitrex prescriptions and for discussion of her mood and possibly starting on an antidepressant  which would not cause interaction regarding serotonin syndrome. She may be able to take Wellbutrin.  I answered all her questions today and the patient was in agreement with the above outlined plan. She will follow up on an as needed basis.

## 2013-03-09 ENCOUNTER — Encounter: Payer: Self-pay | Admitting: Physical Medicine and Rehabilitation

## 2013-03-09 ENCOUNTER — Encounter
Payer: Medicare Other | Attending: Physical Medicine and Rehabilitation | Admitting: Physical Medicine and Rehabilitation

## 2013-03-09 VITALS — BP 118/67 | HR 76 | Resp 14 | Ht 63.0 in | Wt 160.0 lb

## 2013-03-09 DIAGNOSIS — M5137 Other intervertebral disc degeneration, lumbosacral region: Secondary | ICD-10-CM | POA: Insufficient documentation

## 2013-03-09 DIAGNOSIS — M961 Postlaminectomy syndrome, not elsewhere classified: Secondary | ICD-10-CM

## 2013-03-09 DIAGNOSIS — G8929 Other chronic pain: Secondary | ICD-10-CM | POA: Insufficient documentation

## 2013-03-09 DIAGNOSIS — M48061 Spinal stenosis, lumbar region without neurogenic claudication: Secondary | ICD-10-CM | POA: Insufficient documentation

## 2013-03-09 DIAGNOSIS — M51379 Other intervertebral disc degeneration, lumbosacral region without mention of lumbar back pain or lower extremity pain: Secondary | ICD-10-CM | POA: Insufficient documentation

## 2013-03-09 DIAGNOSIS — Z981 Arthrodesis status: Secondary | ICD-10-CM | POA: Insufficient documentation

## 2013-03-09 MED ORDER — OXYCODONE HCL 5 MG PO TABS: 5.0000 mg | ORAL_TABLET | ORAL | Status: DC | PRN

## 2013-03-09 MED ORDER — DIAZEPAM 5 MG PO TABS: 5.0000 mg | ORAL_TABLET | Freq: Two times a day (BID) | ORAL | Status: DC

## 2013-03-09 NOTE — Progress Notes (Signed)
Subjective:    Patient ID: Shirley Hamilton. Hipp, female    DOB: September 30, 1936, 77 y.o.   MRN: [id number]  HPI The patient is a 77 year old female , who presents with chronic LBP . The symptoms started around 10 years ago. The patient complains about increasing moderate to severe pain , which radiate to the right thigh, and in the right posterior hip, which started after her last epidural. Patient also complains about numbness in the right anterior thigh.She describes the pain as aching . Applying heat, taking medications , changing positions alleviate the symptoms. Prolonged standing aggrevates the symptoms. The patient grades her pain as a 7 /10. Hx of PSF in L-spine. The patient reports, that Dr. Etta Quill changed her from Lexapro to Cymbalta.  I referred her to Dr. Danielle Dess, who scheduled surgery, PSF L3-4 on April the 28th. The surgery went well, her radiating Sx into her right have decreased. She is doing considerably well. She is following up with Dr. Danielle Dess.  Pain Inventory Average Pain 7 Pain Right Now 7 My pain is throbbing  In the last 24 hours, has pain interfered with the following? General activity 4 Relation with others 10 Enjoyment of life 0 What TIME of day is your pain at its worst? morning Sleep (in general) Fair  Pain is worse with: walking, bending, sitting, inactivity, standing and some activites Pain improves with: pacing activities and medication Relief from Meds: 7  Mobility walk without assistance how many minutes can you walk? 20 ability to climb steps?  yes do you drive?  yes Do you have any goals in this area?  yes  Function retired  Neuro/Psych weakness tingling trouble walking spasms dizziness confusion depression anxiety loss of taste or smell  Prior Studies surgery  Physicians involved in your care Any changes since last visit?  no   History reviewed. No pertinent family history. History   Social History  . Marital Status: Married     Spouse Name: N/A    Number of Children: N/A  . Years of Education: N/A   Social History Main Topics  . Smoking status: Never Smoker   . Smokeless tobacco: Never Used  . Alcohol Use: Yes     Comment: socially  . Drug Use: No  . Sexually Active: None   Other Topics Concern  . None   Social History Narrative  . None   Past Surgical History  Procedure Laterality Date  . Spine surgery    . Knee surgery      right   Past Medical History  Diagnosis Date  . Neuromuscular disorder   . Anxiety   . Mental disorder   . Arthritis   . Headache    BP 118/67  Pulse 76  Resp 14  Ht 5\' 3"  (1.6 m)  Wt 160 lb (72.576 kg)  BMI 28.35 kg/m2  SpO2 96%     Review of Systems  Constitutional: Positive for unexpected weight change.  Musculoskeletal: Positive for back pain and gait problem.  Neurological: Positive for dizziness and numbness.  Psychiatric/Behavioral: Positive for confusion and dysphoric mood. The patient is nervous/anxious.   All other systems reviewed and are negative.       Objective:   Physical Exam Constitutional: She is oriented to person, place, and time. She appears well-developed and well-nourished.  HENT:  Head: Normocephalic.  Neck: Neck supple.  Musculoskeletal: She exhibits tenderness.  Neurological: She is alert and oriented to person, place, and time.  Skin: Skin is warm  and dry.  Psychiatric: She has a normal mood and affect.  Symmetric normal motor tone is noted throughout. Normal muscle bulk. Muscle testing reveals 5/5 muscle strength of the upper extremity, and 5/5 of the lower extremity. Full range of motion in upper and lower extremities. ROM of spine is restricted. Fine motor movements are normal in both hands.  Sensory is intact and symmetric to light touch, pinprick and proprioception.  DTR in the upper and lower extremity are present and symmetric 1+, trace at right patella reflex. No clonus is noted.  Patient arises from chair without  difficulty. Narrow based gait with normal arm swing bilateral , able to walk on heels and toes . Tandem walk is stable. No pronator drift. Rhomberg negative.  Good finger to nose and heel to shin testing. No tremor, dystaxia or dysmetria noted.        Assessment & Plan:  1. Lumbar post lami syndrome.Hx of Lumbar PSF L4-5, increasing pain with radiation into right anterior thigh, ordered MRI of L-spine.  MRI result :  PSF L4-5 looks satisfactory. L2-3 worsened DDD, stenosis of lateral recess could cause neural compression,  L3-4 worsened DDD, plus 2mm anterolisthesis, multifactorial stenosis, likely to cause neural compression. Referral to neurosurgeon, Dr. Danielle Dess, he scheduled her surgery, PSF L3-4, on April the 28th. Her surgery went well, the radiating Sx into her right thigh have decreased. She is following up with Dr. Danielle Dess. R L3-4 ESI done in March,2013 , which did not help .  RTC 1 month PA visit, continue oxycodone 5 mg 4 times per day, she has received a prescription from Dr. Danielle Dess post surgery, I gave her a new prescription to be filled when due, her pill count was appropriate. The pain medication was not increased although she had this back surgery done.  Continue Valium 5 mg on a when necessary basis for muscle spasm she takes this less than once a day .  The patient reports, that Dr. Etta Quill changed her from Lexapro to Cymbalta, she is also on Flexeril, I explained to the patient that with taking those 2 medications , there is a risk of a serotonin syndrome, she should talk to Dr. Etta Quill about this, and consider going back on the Lexapro, or changing her muscle relaxent to Robaxin. The patient states, that she will call her today. We changed her to Robaxin. Advised patient to continue with her walking and exercise program, as tolerated, and as it was instructed after her surgery.  Follow up in one month.

## 2013-03-09 NOTE — Patient Instructions (Signed)
Stay as active as your limitations after your back surgery allow

## 2013-04-07 ENCOUNTER — Encounter
Payer: Medicare Other | Attending: Physical Medicine and Rehabilitation | Admitting: Physical Medicine and Rehabilitation

## 2013-04-07 ENCOUNTER — Encounter: Payer: Self-pay | Admitting: Physical Medicine and Rehabilitation

## 2013-04-07 VITALS — BP 118/65 | HR 83 | Resp 16 | Ht 64.0 in | Wt 150.0 lb

## 2013-04-07 DIAGNOSIS — Z981 Arthrodesis status: Secondary | ICD-10-CM | POA: Insufficient documentation

## 2013-04-07 DIAGNOSIS — M545 Low back pain, unspecified: Secondary | ICD-10-CM | POA: Insufficient documentation

## 2013-04-07 DIAGNOSIS — M961 Postlaminectomy syndrome, not elsewhere classified: Secondary | ICD-10-CM

## 2013-04-07 MED ORDER — GABAPENTIN 300 MG PO CAPS: 300.0000 mg | ORAL_CAPSULE | Freq: Three times a day (TID) | ORAL | Status: DC

## 2013-04-07 MED ORDER — OXYCODONE HCL 5 MG PO TABS: 5.0000 mg | ORAL_TABLET | ORAL | Status: DC | PRN

## 2013-04-07 NOTE — Progress Notes (Signed)
Subjective:    Patient ID: Shirley Favia. Ehmann, female    DOB: [birth year], 77 y.o.   MRN: [id number]  HPI The patient is a 77 year old female , who presents with chronic LBP . The symptoms started around 10 years ago. The patient complains about increasing moderate to severe pain , which radiate to the right thigh, and in the right posterior hip, which started after her last epidural. Patient also complains about numbness in the right anterior thigh.She describes the pain as aching . Applying heat, taking medications , changing positions alleviate the symptoms. Prolonged standing aggrevates the symptoms. The patient grades her pain as a 7 /10. Hx of PSF in L-spine. The patient reports, that Dr. Etta Quill changed her from Lexapro to Cymbalta.  I referred her to Dr. Danielle Dess, who scheduled surgery, PSF L3-4 on April the 28th. The surgery went well, her radiating Sx into her right have decreased. She is doing considerably well. She is following up with Dr. Danielle Dess.  Pain Inventory Average Pain 9 Pain Right Now 9 My pain is constant  In the last 24 hours, has pain interfered with the following? General activity 7 Relation with others na Enjoyment of life na What TIME of day is your pain at its worst? na Sleep (in general) NA  Pain is worse with: walking and standing Pain improves with: medication Relief from Meds: 6  Mobility walk without assistance ability to climb steps?  yes do you drive?  yes Do you have any goals in this area?  yes  Function retired  Neuro/Psych No problems in this area  Prior Studies Any changes since last visit?  yes x-rays CT/MRI  Physicians involved in your care Any changes since last visit?  no   History reviewed. No pertinent family history. History   Social History  . Marital Status: Married    Spouse Name: N/A    Number of Children: N/A  . Years of Education: N/A   Social History Main Topics  . Smoking status: Never Smoker   . Smokeless  tobacco: Never Used  . Alcohol Use: Yes     Comment: socially  . Drug Use: No  . Sexually Active: None   Other Topics Concern  . None   Social History Narrative  . None   Past Surgical History  Procedure Laterality Date  . Spine surgery    . Knee surgery      right   Past Medical History  Diagnosis Date  . Neuromuscular disorder   . Anxiety   . Mental disorder   . Arthritis   . Headache(784.0)    BP 118/65  Pulse 83  Resp 16  Ht 5\' 4"  (1.626 m)  Wt 150 lb (68.04 kg)  BMI 25.73 kg/m2  SpO2 97%     Review of Systems  Constitutional: Positive for unexpected weight change.  All other systems reviewed and are negative.       Objective:   Physical Exam Constitutional: She is oriented to person, place, and time. She appears well-developed and well-nourished.  HENT:  Head: Normocephalic.  Neck: Neck supple.  Musculoskeletal: She exhibits tenderness.  Neurological: She is alert and oriented to person, place, and time.  Skin: Skin is warm and dry.  Psychiatric: She has a normal mood and affect.  Symmetric normal motor tone is noted throughout. Normal muscle bulk. Muscle testing reveals 5/5 muscle strength of the upper extremity, and 5/5 of the lower extremity. Full range of motion in upper and  lower extremities. ROM of spine is restricted. Fine motor movements are normal in both hands.  Sensory is intact and symmetric to light touch, pinprick and proprioception.  DTR in the upper and lower extremity are present and symmetric 1+, trace at right patella reflex. No clonus is noted.  Patient arises from chair without difficulty. Narrow based gait with normal arm swing bilateral , able to walk on heels and toes . Tandem walk is stable. No pronator drift. Rhomberg negative.  Good finger to nose and heel to shin testing. No tremor, dystaxia or dysmetria noted.        Assessment & Plan:  1. Lumbar post lami syndrome.Hx of Lumbar PSF L4-5, increasing pain with radiation  into right anterior thigh, ordered MRI of L-spine.  MRI result :  PSF L4-5 looks satisfactory. L2-3 worsened DDD, stenosis of lateral recess could cause neural compression,  L3-4 worsened DDD, plus 2mm anterolisthesis, multifactorial stenosis, likely to cause neural compression. Referral to neurosurgeon, Dr. Danielle Dess, he scheduled her surgery, PSF L3-4, on April the 28th. Her surgery went well, the radiating Sx into her right thigh have decreased. She is following up with Dr. Danielle Dess.  R L3-4 ESI done in March,2013 , which did not help .  RTC 1 month PA visit, continue oxycodone 5 mg 4 times per day, she has received a prescription from Dr. Danielle Dess post surgery, I gave her a new prescription to be filled when due, her pill count was appropriate. The pain medication was not increased although she had this back surgery done.  Continue Valium 5 mg on a when necessary basis for muscle spasm she takes this less than once a day .  The patient reports, that Dr. Etta Quill changed her from Lexapro to Cymbalta, she is also on Flexeril, I explained to the patient that with taking those 2 medications , there is a risk of a serotonin syndrome, she should talk to Dr. Etta Quill about this, and consider going back on the Lexapro, or changing her muscle relaxent to Robaxin. The patient states, that she will call her today. We changed her to Robaxin.  Advised patient to continue with her walking and exercise program, as tolerated, and as it was instructed after her surgery.  Follow up in one month.

## 2013-04-07 NOTE — Patient Instructions (Signed)
Continue with your walking program and stay as active as pain permits

## 2013-05-09 ENCOUNTER — Encounter: Payer: Self-pay | Admitting: Physical Medicine and Rehabilitation

## 2013-05-09 ENCOUNTER — Encounter
Payer: Medicare Other | Attending: Physical Medicine and Rehabilitation | Admitting: Physical Medicine and Rehabilitation

## 2013-05-09 VITALS — BP 138/64 | HR 82 | Resp 14 | Ht 64.0 in | Wt 146.0 lb

## 2013-05-09 DIAGNOSIS — M5137 Other intervertebral disc degeneration, lumbosacral region: Secondary | ICD-10-CM | POA: Insufficient documentation

## 2013-05-09 DIAGNOSIS — Z79899 Other long term (current) drug therapy: Secondary | ICD-10-CM | POA: Insufficient documentation

## 2013-05-09 DIAGNOSIS — M51379 Other intervertebral disc degeneration, lumbosacral region without mention of lumbar back pain or lower extremity pain: Secondary | ICD-10-CM | POA: Insufficient documentation

## 2013-05-09 DIAGNOSIS — M961 Postlaminectomy syndrome, not elsewhere classified: Secondary | ICD-10-CM | POA: Insufficient documentation

## 2013-05-09 DIAGNOSIS — Z981 Arthrodesis status: Secondary | ICD-10-CM | POA: Insufficient documentation

## 2013-05-09 MED ORDER — OXYCODONE HCL 5 MG PO TABS: 5.0000 mg | ORAL_TABLET | ORAL | Status: DC | PRN

## 2013-05-09 NOTE — Progress Notes (Signed)
Subjective:    Patient ID: Shirley Hamilton. Esperanza, female    DOB: [birth year], 77 y.o.   MRN: [id number]  HPI The patient is a 77 year old female , who presents with chronic LBP . The symptoms started around 10 years ago. The patient complains about increasing moderate to severe pain , which radiate to the right thigh, and in the right posterior hip, which started after her last epidural. Patient also complains about numbness in the right anterior thigh.She describes the pain as aching . Applying heat, taking medications , changing positions alleviate the symptoms. Prolonged standing aggrevates the symptoms. The patient grades her pain as a 7 /10. Hx of PSF in L-spine. The patient reports, that Dr. Etta Quill changed her from Lexapro to Cymbalta.  I referred her to Dr. Danielle Dess, who scheduled surgery, PSF L3-4 on April the 28th. The surgery went well, her radiating Sx into her right have decreased. She is doing considerably well. She is following up with Dr. Danielle Dess.  Pain Inventory Average Pain 9 Pain Right Now 9 My pain is constant  In the last 24 hours, has pain interfered with the following? General activity 7 Relation with others 0 Enjoyment of life 0 What TIME of day is your pain at its worst? constant Sleep (in general) Fair  Pain is worse with: walking and standing Pain improves with: medication Relief from Meds: 6  Mobility walk without assistance ability to climb steps?  yes do you drive?  yes  Function retired  Neuro/Psych No problems in this area  Prior Studies Any changes since last visit?  no  Physicians involved in your care back surgery   History reviewed. No pertinent family history. History   Social History  . Marital Status: Married    Spouse Name: N/A    Number of Children: N/A  . Years of Education: N/A   Social History Main Topics  . Smoking status: Never Smoker   . Smokeless tobacco: Never Used  . Alcohol Use: Yes     Comment: socially  . Drug Use:  No  . Sexually Active: None   Other Topics Concern  . None   Social History Narrative  . None   Past Surgical History  Procedure Laterality Date  . Spine surgery    . Knee surgery      right   Past Medical History  Diagnosis Date  . Neuromuscular disorder   . Anxiety   . Mental disorder   . Arthritis   . Headache(784.0)    BP 138/64  Pulse 82  Resp 14  Ht 5\' 4"  (1.626 m)  Wt 146 lb (66.225 kg)  BMI 25.05 kg/m2  SpO2 94%     Review of Systems  Musculoskeletal: Positive for back pain.  All other systems reviewed and are negative.       Objective:   Physical Exam Constitutional: She is oriented to person, place, and time. She appears well-developed and well-nourished.  HENT:  Head: Normocephalic.  Neck: Neck supple.  Musculoskeletal: She exhibits tenderness.  Neurological: She is alert and oriented to person, place, and time.  Skin: Skin is warm and dry.  Psychiatric: She has a normal mood and affect.  Symmetric normal motor tone is noted throughout. Normal muscle bulk. Muscle testing reveals 5/5 muscle strength of the upper extremity, and 5/5 of the lower extremity. Full range of motion in upper and lower extremities. ROM of spine is restricted. Fine motor movements are normal in both hands.  Sensory is  intact and symmetric to light touch, pinprick and proprioception.  DTR in the upper and lower extremity are present and symmetric 1+, trace at right patella reflex. No clonus is noted.  Patient arises from chair without difficulty. Narrow based gait with normal arm swing bilateral , able to walk on heels and toes . Tandem walk is stable. No pronator drift. Rhomberg negative.  Good finger to nose and heel to shin testing. No tremor, dystaxia or dysmetria noted.        Assessment & Plan:  1. Lumbar post lami syndrome.Hx of Lumbar PSF L4-5, increasing pain with radiation into right anterior thigh, ordered MRI of L-spine.  MRI result :  PSF L4-5 looks  satisfactory. L2-3 worsened DDD, stenosis of lateral recess could cause neural compression,  L3-4 worsened DDD, plus 2mm anterolisthesis, multifactorial stenosis, likely to cause neural compression. Referral to neurosurgeon, Dr. Danielle Dess, he scheduled her surgery, PSF L3-4, on April the 28th. Her surgery went well, the radiating Sx into her right thigh have resolved. She is following up with Dr. Danielle Dess, she saw him last week, and he was satisfied with her healing process.    RTC 1 month PA visit, continue oxycodone 5 mg 4 times per day, she has received a prescription from Dr. Danielle Dess post surgery, I gave her a new prescription to be filled when due, her pill count was appropriate. The pain medication was not increased although she had this back surgery done.  Continue Valium 5 mg on a when necessary basis for muscle spasm she takes this less than once a day .  The patient reports, that Dr. Etta Quill changed her from Lexapro to Cymbalta, she is also on Flexeril, I explained to the patient that with taking those 2 medications , there is a risk of a serotonin syndrome, she should talk to Dr. Etta Quill about this, and consider going back on the Lexapro, or changing her muscle relaxent to Robaxin. The patient states, that she will call her today. We changed her to Robaxin.  Advised patient to continue with her walking and exercise program, as tolerated, and as it was instructed after her surgery.  Follow up in one month.

## 2013-05-09 NOTE — Patient Instructions (Signed)
Continue with your exercise program 

## 2013-06-14 ENCOUNTER — Encounter
Payer: Medicare Other | Attending: Physical Medicine and Rehabilitation | Admitting: Physical Medicine and Rehabilitation

## 2013-06-14 ENCOUNTER — Encounter: Payer: Self-pay | Admitting: Physical Medicine and Rehabilitation

## 2013-06-14 VITALS — BP 112/60 | HR 85 | Resp 14 | Ht 64.0 in | Wt 158.0 lb

## 2013-06-14 DIAGNOSIS — M961 Postlaminectomy syndrome, not elsewhere classified: Secondary | ICD-10-CM

## 2013-06-14 DIAGNOSIS — Z79899 Other long term (current) drug therapy: Secondary | ICD-10-CM | POA: Insufficient documentation

## 2013-06-14 DIAGNOSIS — M51379 Other intervertebral disc degeneration, lumbosacral region without mention of lumbar back pain or lower extremity pain: Secondary | ICD-10-CM | POA: Insufficient documentation

## 2013-06-14 DIAGNOSIS — M5137 Other intervertebral disc degeneration, lumbosacral region: Secondary | ICD-10-CM | POA: Insufficient documentation

## 2013-06-14 MED ORDER — OXYCODONE HCL 5 MG PO TABS: 5.0000 mg | ORAL_TABLET | ORAL | Status: DC | PRN

## 2013-06-14 NOTE — Progress Notes (Signed)
Subjective:    Patient ID: Shirley Hamilton. Mayer, female    DOB: Oct 23, 1935, 77 y.o.   MRN: [id number]  HPI The patient is a 77 year old female , who presents with chronic LBP . The symptoms started around 10 years ago. The patient complains about increasing moderate to severe pain , which radiate to the right thigh, and in the right posterior hip, which started after her last epidural. Patient also complains about numbness in the right anterior thigh.She describes the pain as aching . Applying heat, taking medications , changing positions alleviate the symptoms. Prolonged standing aggrevates the symptoms. The patient grades her pain as a 7 /10. Hx of PSF in L-spine. The patient reports, that Dr. Etta Quill changed her from Lexapro to Cymbalta.  I referred her to Dr. Danielle Dess, who scheduled surgery, PSF L3-4 on April the 28th. The surgery went well, her radiating Sx into her right have decreased. She is doing considerably well. She is following up with Dr. Danielle Dess.  Pain Inventory Average Pain 6 Pain Right Now 7 My pain is intermittent, sharp, dull and stabbing  In the last 24 hours, has pain interfered with the following? General activity 6 Relation with others 6 Enjoyment of life 6 What TIME of day is your pain at its worst? day and night Sleep (in general) Fair  Pain is worse with: standing and sleep Pain improves with: rest and medication Relief from Meds: 7  Mobility walk without assistance how many minutes can you walk? 15 ability to climb steps?  yes do you drive?  yes  Function retired  Neuro/Psych No problems in this area  Prior Studies Any changes since last visit?  no  Physicians involved in your care cataract surgery   History reviewed. No pertinent family history. History   Social History  . Marital Status: Married    Spouse Name: N/A    Number of Children: N/A  . Years of Education: N/A   Social History Main Topics  . Smoking status: Never Smoker   .  Smokeless tobacco: Never Used  . Alcohol Use: Yes     Comment: socially  . Drug Use: No  . Sexual Activity: None   Other Topics Concern  . None   Social History Narrative  . None   Past Surgical History  Procedure Laterality Date  . Spine surgery    . Knee surgery      right   Past Medical History  Diagnosis Date  . Neuromuscular disorder   . Anxiety   . Mental disorder   . Arthritis   . Headache(784.0)    BP 112/60  Pulse 85  Resp 14  Ht 5\' 4"  (1.626 m)  Wt 158 lb (71.668 kg)  BMI 27.11 kg/m2  SpO2 95%     Review of Systems  Neurological: Positive for headaches.  All other systems reviewed and are negative.       Objective:   Physical Exam Constitutional: She is oriented to person, place, and time. She appears well-developed and well-nourished.  HENT:  Head: Normocephalic.  Neck: Neck supple.  Musculoskeletal: She exhibits tenderness.  Neurological: She is alert and oriented to person, place, and time.  Skin: Skin is warm and dry.  Psychiatric: She has a normal mood and affect.  Symmetric normal motor tone is noted throughout. Normal muscle bulk. Muscle testing reveals 5/5 muscle strength of the upper extremity, and 5/5 of the lower extremity. Full range of motion in upper and lower extremities. ROM of  spine is restricted. Fine motor movements are normal in both hands.  Sensory is intact and symmetric to light touch, pinprick and proprioception.  DTR in the upper and lower extremity are present and symmetric 1+, trace at right patella reflex. No clonus is noted.  Patient arises from chair without difficulty. Narrow based gait with normal arm swing bilateral , able to walk on heels and toes . Tandem walk is stable. No pronator drift. Rhomberg negative.  Good finger to nose and heel to shin testing. No tremor, dystaxia or dysmetria noted.        Assessment & Plan:  1. Lumbar post lami syndrome.Hx of Lumbar PSF L4-5, increasing pain with radiation into  right anterior thigh, ordered MRI of L-spine.  MRI result :  PSF L4-5 looks satisfactory. L2-3 worsened DDD, stenosis of lateral recess could cause neural compression,  L3-4 worsened DDD, plus 2mm anterolisthesis, multifactorial stenosis, likely to cause neural compression. Referral to neurosurgeon, Dr. Danielle Dess, he scheduled her surgery, PSF L3-4, on April the 28th. Her surgery went well, the radiating Sx into her right thigh have resolved. She is following up with Dr. Danielle Dess, she saw him last week, and he was satisfied with her healing process.  RTC 1 month PA visit, continue oxycodone 5 mg 4 times per day, she has received a prescription from Dr. Danielle Dess post surgery, I gave her a new prescription to be filled when due, her pill count was appropriate. The pain medication was not increased although she had this back surgery done.  Continue Valium 5 mg on a when necessary basis for muscle spasm she takes this less than once a day .  The patient reports, that Dr. Etta Quill changed her from Lexapro to Cymbalta, she is also on Flexeril, I explained to the patient that with taking those 2 medications , there is a risk of a serotonin syndrome, she should talk to Dr. Etta Quill about this, and consider going back on the Lexapro, or changing her muscle relaxent to Robaxin. The patient states, that she will call her today. We changed her to Robaxin.  Advised patient to continue with her walking and exercise program, as tolerated, and as it was instructed after her surgery.  Follow up in one month.

## 2013-06-14 NOTE — Patient Instructions (Signed)
Continue with your exercise and walking program 

## 2013-07-11 ENCOUNTER — Ambulatory Visit: Payer: Medicare Other | Admitting: Physical Medicine & Rehabilitation

## 2013-07-12 ENCOUNTER — Encounter: Payer: Self-pay | Admitting: Physical Medicine and Rehabilitation

## 2013-07-12 ENCOUNTER — Encounter
Payer: Medicare Other | Attending: Physical Medicine and Rehabilitation | Admitting: Physical Medicine and Rehabilitation

## 2013-07-12 VITALS — BP 114/62 | HR 76 | Resp 14 | Ht 64.0 in | Wt 166.2 lb

## 2013-07-12 DIAGNOSIS — R209 Unspecified disturbances of skin sensation: Secondary | ICD-10-CM | POA: Insufficient documentation

## 2013-07-12 DIAGNOSIS — Z981 Arthrodesis status: Secondary | ICD-10-CM | POA: Insufficient documentation

## 2013-07-12 DIAGNOSIS — M961 Postlaminectomy syndrome, not elsewhere classified: Secondary | ICD-10-CM

## 2013-07-12 DIAGNOSIS — M51379 Other intervertebral disc degeneration, lumbosacral region without mention of lumbar back pain or lower extremity pain: Secondary | ICD-10-CM | POA: Insufficient documentation

## 2013-07-12 DIAGNOSIS — G8929 Other chronic pain: Secondary | ICD-10-CM | POA: Insufficient documentation

## 2013-07-12 DIAGNOSIS — M5137 Other intervertebral disc degeneration, lumbosacral region: Secondary | ICD-10-CM | POA: Insufficient documentation

## 2013-07-12 DIAGNOSIS — M545 Low back pain, unspecified: Secondary | ICD-10-CM | POA: Insufficient documentation

## 2013-07-12 DIAGNOSIS — Z79899 Other long term (current) drug therapy: Secondary | ICD-10-CM | POA: Insufficient documentation

## 2013-07-12 MED ORDER — METHOCARBAMOL 500 MG PO TABS: 500.0000 mg | ORAL_TABLET | Freq: Three times a day (TID) | ORAL | Status: DC

## 2013-07-12 MED ORDER — OXYCODONE HCL 5 MG PO TABS: 5.0000 mg | ORAL_TABLET | ORAL | Status: DC | PRN

## 2013-07-12 MED ORDER — DIAZEPAM 5 MG PO TABS: 5.0000 mg | ORAL_TABLET | Freq: Two times a day (BID) | ORAL | Status: DC

## 2013-07-12 NOTE — Patient Instructions (Signed)
Continue with your exercise program as tolerated 

## 2013-07-12 NOTE — Progress Notes (Signed)
Subjective:    Patient ID: Shirley Hamilton. [place], female    DOB: 1936-10-15, 77 y.o.   MRN: [id number]  HPI The patient is a 77 year old female , who presents with chronic LBP . The symptoms started around 10 years ago. The patient complains about increasing moderate to severe pain , which radiate to the right thigh, and in the right posterior hip, which started after her last epidural. Patient also complains about numbness in the right anterior thigh.She describes the pain as aching . Applying heat, taking medications , changing positions alleviate the symptoms. Prolonged standing aggrevates the symptoms. The patient grades her pain as a 8 /10. Hx of PSF in L-spine. The patient reports, that Dr. Etta Quill changed her from Lexapro to Cymbalta.  I referred her to Dr. Danielle Dess, who scheduled surgery, PSF L3-4 on April the 28th. The surgery went well, her radiating Sx into her right have decreased. She is doing considerably well. She is following up with Dr. Danielle Dess  Pain Inventory Average Pain 8 Pain Right Now 8 My pain is dull and aching  In the last 24 hours, has pain interfered with the following? General activity 6 Relation with others 6 Enjoyment of life 6 What TIME of day is your pain at its worst? morning, night Sleep (in general) Fair  Pain is worse with: standing and sleep Pain improves with: medication and turning Relief from Meds: 7  Mobility walk with assistance ability to climb steps?  yes do you drive?  yes transfers alone Do you have any goals in this area?  yes  Function retired Do you have any goals in this area?  yes  Neuro/Psych anxiety  Prior Studies Any changes since last visit?  no  Physicians involved in your care Any changes since last visit?  no   History reviewed. No pertinent family history. History   Social History  . Marital Status: Married    Spouse Name: N/A    Number of Children: N/A  . Years of Education: N/A   Social History Main Topics   . Smoking status: Never Smoker   . Smokeless tobacco: Never Used  . Alcohol Use: Yes     Comment: socially  . Drug Use: No  . Sexual Activity: None   Other Topics Concern  . None   Social History Narrative  . None   Past Surgical History  Procedure Laterality Date  . Spine surgery    . Knee surgery      right  . Cataract extraction, bilateral Bilateral    Past Medical History  Diagnosis Date  . Neuromuscular disorder   . Anxiety   . Mental disorder   . Arthritis   . Headache(784.0)   . Cataract     left and right   BP 114/62  Pulse 76  Resp 14  Ht 5\' 4"  (1.626 m)  Wt 166 lb 3.2 oz (75.388 kg)  BMI 28.51 kg/m2  SpO2 96%     Review of Systems  Psychiatric/Behavioral: The patient is nervous/anxious.   All other systems reviewed and are negative.       Objective:   Physical Exam Constitutional: She is oriented to person, place, and time. She appears well-developed and well-nourished.  HENT:  Head: Normocephalic.  Neck: Neck supple.  Musculoskeletal: She exhibits tenderness.  Neurological: She is alert and oriented to person, place, and time.  Skin: Skin is warm and dry.  Psychiatric: She has a normal mood and affect.  Symmetric normal motor  tone is noted throughout. Normal muscle bulk. Muscle testing reveals 5/5 muscle strength of the upper extremity, and 5/5 of the lower extremity. Full range of motion in upper and lower extremities. ROM of spine is restricted. Fine motor movements are normal in both hands.  Sensory is intact and symmetric to light touch, pinprick and proprioception.  DTR in the upper and lower extremity are present and symmetric 1+, trace at right patella reflex. No clonus is noted.  Patient arises from chair without difficulty. Narrow based gait with normal arm swing bilateral , able to walk on heels and toes . Tandem walk is stable. No pronator drift. Rhomberg negative.  Good finger to nose and heel to shin testing. No tremor, dystaxia  or dysmetria noted.         Assessment & Plan:  1. Lumbar post lami syndrome.Hx of Lumbar PSF L4-5, increasing pain with radiation into right anterior thigh, ordered MRI of L-spine.  MRI result :  PSF L4-5 looks satisfactory. L2-3 worsened DDD, stenosis of lateral recess could cause neural compression,  L3-4 worsened DDD, plus 2mm anterolisthesis, multifactorial stenosis, likely to cause neural compression. Referral to neurosurgeon, Dr. Danielle Dess, he scheduled her surgery, PSF L3-4, on April the 28th. Her surgery went well, the radiating Sx into her right thigh have resolved. She is following up with Dr. Danielle Dess, she saw him last month, and he was satisfied with her healing process.  RTC 1 month PA visit, continue oxycodone 5 mg 4 times per day, she has received a prescription from Dr. Danielle Dess post surgery, I gave her a new prescription to be filled when due, her pill count was appropriate. The pain medication was not increased although she had this back surgery done.  Continue Valium 5 mg on a when necessary basis for muscle spasm she takes this less than once a day .  The patient reports, that Dr. Etta Quill changed her from Lexapro to Cymbalta, she is also on Flexeril, I explained to the patient that with taking those 2 medications , there is a risk of a serotonin syndrome, she should talk to Dr. Etta Quill about this, and consider going back on the Lexapro, or changing her muscle relaxent to Robaxin. The patient states, that she will call her today. We changed her to Robaxin.  Advised patient to continue with her walking and exercise program, as tolerated, and as it was instructed after her surgery.  Follow up in one month.

## 2013-07-18 ENCOUNTER — Other Ambulatory Visit: Payer: Self-pay

## 2013-07-18 MED ORDER — GABAPENTIN 300 MG PO CAPS: 300.0000 mg | ORAL_CAPSULE | Freq: Three times a day (TID) | ORAL | Status: DC

## 2013-08-09 ENCOUNTER — Ambulatory Visit: Payer: Medicare Other | Admitting: Physical Medicine and Rehabilitation

## 2013-08-16 ENCOUNTER — Other Ambulatory Visit: Payer: Self-pay

## 2013-08-16 ENCOUNTER — Encounter: Payer: Medicare Other | Admitting: Physical Medicine and Rehabilitation

## 2013-08-16 MED ORDER — OXYCODONE HCL 5 MG PO TABS: 5.0000 mg | ORAL_TABLET | ORAL | Status: DC | PRN

## 2013-08-17 ENCOUNTER — Encounter: Payer: Self-pay | Admitting: Physical Medicine and Rehabilitation

## 2013-08-17 ENCOUNTER — Encounter
Payer: Medicare Other | Attending: Physical Medicine and Rehabilitation | Admitting: Physical Medicine and Rehabilitation

## 2013-08-17 VITALS — BP 112/61 | HR 76 | Resp 14 | Ht 64.0 in | Wt 167.0 lb

## 2013-08-17 DIAGNOSIS — M545 Low back pain, unspecified: Secondary | ICD-10-CM | POA: Insufficient documentation

## 2013-08-17 DIAGNOSIS — M961 Postlaminectomy syndrome, not elsewhere classified: Secondary | ICD-10-CM | POA: Insufficient documentation

## 2013-08-17 DIAGNOSIS — Z79899 Other long term (current) drug therapy: Secondary | ICD-10-CM | POA: Insufficient documentation

## 2013-08-17 DIAGNOSIS — Z981 Arthrodesis status: Secondary | ICD-10-CM | POA: Insufficient documentation

## 2013-08-17 NOTE — Progress Notes (Signed)
Subjective:    Patient ID: Shirley Hamilton. Dhaliwal, female    DOB: [birth year], 77 y.o.   MRN: [id number]  HPI The patient is a 77 year old female , who presents with chronic LBP . The symptoms started around 10 years ago. The patient complains about increasing moderate to severe pain , which radiate to the right thigh, and in the right posterior hip, which started after her last epidural. Patient also complains about numbness in the right anterior thigh.She describes the pain as aching . Applying heat, taking medications , changing positions alleviate the symptoms. Prolonged standing aggrevates the symptoms. The patient grades her pain as a 8 /10. Hx of PSF in L-spine. The patient reports, that Dr. Etta Quill changed her from Lexapro to Cymbalta.  I referred her to Dr. Danielle Dess, who scheduled surgery, PSF L3-4 on April the 28th. The surgery went well, her radiating Sx into her right have decreased. She is doing considerably well. She is following up with Dr. Danielle Dess  Pain Inventory Average Pain 8 Pain Right Now 8 My pain is sharp and aching  In the last 24 hours, has pain interfered with the following? General activity 6 Relation with others 6 Enjoyment of life 6 What TIME of day is your pain at its worst? morning, night Sleep (in general) Fair  Pain is worse with: standing and sleep Pain improves with: medication and turning Relief from Meds: 7  Mobility walk with assistance ability to climb steps?  yes do you drive?  yes transfers alone Do you have any goals in this area?  yes  Function retired Do you have any goals in this area?  yes  Neuro/Psych numbness anxiety  Prior Studies Any changes since last visit?  no  Physicians involved in your care Any changes since last visit?  no   History reviewed. No pertinent family history. History   Social History  . Marital Status: Married    Spouse Name: N/A    Number of Children: N/A  . Years of Education: N/A   Social History  Main Topics  . Smoking status: Never Smoker   . Smokeless tobacco: Never Used  . Alcohol Use: Yes     Comment: socially  . Drug Use: No  . Sexual Activity: None   Other Topics Concern  . None   Social History Narrative  . None   Past Surgical History  Procedure Laterality Date  . Spine surgery    . Knee surgery      right  . Cataract extraction, bilateral Bilateral    Past Medical History  Diagnosis Date  . Neuromuscular disorder   . Anxiety   . Mental disorder   . Arthritis   . Headache(784.0)   . Cataract     left and right   BP 112/61  Pulse 76  Resp 14  Ht 5\' 4"  (1.626 m)  Wt 167 lb (75.751 kg)  BMI 28.65 kg/m2  SpO2 93%     Review of Systems  Neurological: Positive for numbness.  Psychiatric/Behavioral: The patient is nervous/anxious.   All other systems reviewed and are negative.       Objective:   Physical Exam Constitutional: She is oriented to person, place, and time. She appears well-developed and well-nourished.  HENT:  Head: Normocephalic.  Neck: Neck supple.  Musculoskeletal: She exhibits tenderness.  Neurological: She is alert and oriented to person, place, and time.  Skin: Skin is warm and dry.  Psychiatric: She has a normal mood and affect.  Symmetric normal motor tone is noted throughout. Normal muscle bulk. Muscle testing reveals 5/5 muscle strength of the upper extremity, and 5/5 of the lower extremity. Full range of motion in upper and lower extremities. ROM of spine is restricted. Fine motor movements are normal in both hands.  Sensory is intact and symmetric to light touch, pinprick and proprioception.  DTR in the upper and lower extremity are present and symmetric 1+, trace at right patella reflex. No clonus is noted.  Patient arises from chair without difficulty. Narrow based gait with normal arm swing bilateral , able to walk on heels and toes . Tandem walk is stable. No pronator drift. Rhomberg negative.  Good finger to nose  and heel to shin testing. No tremor, dystaxia or dysmetria noted.        Assessment & Plan:  1. Lumbar post lami syndrome.Hx of Lumbar PSF L4-5, increasing pain with radiation into right anterior thigh, ordered MRI of L-spine.  MRI result :  PSF L4-5 looks satisfactory. L2-3 worsened DDD, stenosis of lateral recess could cause neural compression,  L3-4 worsened DDD, plus 2mm anterolisthesis, multifactorial stenosis, likely to cause neural compression. Referral to neurosurgeon, Dr. Danielle Dess, he scheduled her surgery, PSF L3-4, on April the 28th. Her surgery went well, the radiating Sx into her right thigh have resolved. She is following up with Dr. Danielle Dess, she saw him last month, and he was satisfied with her healing process.  RTC 1 month PA visit, continue oxycodone 5 mg 4 times per day, she has received a prescription from Dr. Danielle Dess post surgery, I gave her a new prescription to be filled when due, her pill count was appropriate. The pain medication was not increased although she had this back surgery done.  Continue Valium 5 mg on a when necessary basis for muscle spasm she takes this less than once a day .  The patient reports, that Dr. Etta Quill changed her from Lexapro to Cymbalta, she is also on Flexeril, I explained to the patient that with taking those 2 medications , there is a risk of a serotonin syndrome, she should talk to Dr. Etta Quill about this, and consider going back on the Lexapro, or changing her muscle relaxent to Robaxin. The patient states, that she will call her today. We changed her to Robaxin.  Advised patient to continue with her walking and exercise program, as tolerated, and as it was instructed after her surgery.  Follow up in one month.

## 2013-08-17 NOTE — Patient Instructions (Signed)
Start with a walking program as it is safe and as tolerated

## 2013-09-14 ENCOUNTER — Encounter: Payer: Self-pay | Admitting: Physical Medicine and Rehabilitation

## 2013-09-14 ENCOUNTER — Encounter
Payer: Medicare Other | Attending: Physical Medicine and Rehabilitation | Admitting: Physical Medicine and Rehabilitation

## 2013-09-14 VITALS — BP 116/58 | HR 78 | Resp 14 | Ht 64.5 in | Wt 169.8 lb

## 2013-09-14 DIAGNOSIS — Z981 Arthrodesis status: Secondary | ICD-10-CM | POA: Insufficient documentation

## 2013-09-14 DIAGNOSIS — M961 Postlaminectomy syndrome, not elsewhere classified: Secondary | ICD-10-CM | POA: Insufficient documentation

## 2013-09-14 DIAGNOSIS — M51379 Other intervertebral disc degeneration, lumbosacral region without mention of lumbar back pain or lower extremity pain: Secondary | ICD-10-CM | POA: Insufficient documentation

## 2013-09-14 DIAGNOSIS — M5137 Other intervertebral disc degeneration, lumbosacral region: Secondary | ICD-10-CM | POA: Insufficient documentation

## 2013-09-14 DIAGNOSIS — M62838 Other muscle spasm: Secondary | ICD-10-CM | POA: Insufficient documentation

## 2013-09-14 MED ORDER — OXYCODONE HCL 5 MG PO TABS: 5.0000 mg | ORAL_TABLET | ORAL | Status: DC | PRN

## 2013-09-14 NOTE — Patient Instructions (Signed)
Stay as active as tolerated. 

## 2013-09-14 NOTE — Progress Notes (Signed)
Subjective:    Patient ID: Shirley Hamilton. Rau, female    DOB: 05-03-1936, 77 y.o.   MRN: [id number]  HPI The patient is a 77 year old female , who presents with chronic LBP . The symptoms started around 10 years ago. The patient complains about increasing moderate to severe pain , which radiate to the right thigh, and in the right posterior hip, which started after her last epidural. Patient also complains about numbness in the right anterior thigh.She describes the pain as aching . Applying heat, taking medications , changing positions alleviate the symptoms. Prolonged standing aggrevates the symptoms. The patient grades her pain as a 8 /10. Hx of PSF in L-spine. The patient reports, that Dr. Etta Quill changed her from Lexapro to Cymbalta.  I referred her to Dr. Danielle Dess, who scheduled surgery, PSF L3-4 on April the 28th. The surgery went well, her radiating Sx into her right have decreased. She is doing considerably well. She is following up with Dr. Danielle Dess She reports that Dr. Danielle Dess gave a prescription for a rollator, but she bought one , at a place where medicare was not excepted, she needed to have a light weight one, because of her recent PSF, she should not lift too heavy, now she would like to get reimbursed by medicare, and is asking whether we could help her with this, I will talk to [facility], whether there is any way we can, after she already bought it.  Pain Inventory Average Pain 8 Pain Right Now 7 My pain is dull and stabbing  In the last 24 hours, has pain interfered with the following? General activity 7 Relation with others 6 Enjoyment of life 6 What TIME of day is your pain at its worst? morning, daytime Sleep (in general) Fair  Pain is worse with: sitting and standing Pain improves with: medication Relief from Meds: 6  Mobility walk without assistance Do you have any goals in this area?  yes  Function retired  Neuro/Psych numbness spasms depression  Prior  Studies Any changes since last visit?  no  Physicians involved in your care Any changes since last visit?  no   History reviewed. No pertinent family history. History   Social History  . Marital Status: Married    Spouse Name: N/A    Number of Children: N/A  . Years of Education: N/A   Social History Main Topics  . Smoking status: Never Smoker   . Smokeless tobacco: Never Used  . Alcohol Use: Yes     Comment: socially  . Drug Use: No  . Sexual Activity: None   Other Topics Concern  . None   Social History Narrative  . None   Past Surgical History  Procedure Laterality Date  . Spine surgery    . Knee surgery      right  . Cataract extraction, bilateral Bilateral    Past Medical History  Diagnosis Date  . Neuromuscular disorder   . Anxiety   . Mental disorder   . Arthritis   . Headache(784.0)   . Cataract     left and right   BP 116/58  Pulse 78  Resp 14  Ht 5' 4.5" (1.638 m)  Wt 169 lb 12.8 oz (77.021 kg)  BMI 28.71 kg/m2  SpO2 94%      Review of Systems  Constitutional: Positive for unexpected weight change.  Musculoskeletal: Positive for back pain.  Neurological: Positive for numbness.       Spasms  Psychiatric/Behavioral: Positive  for dysphoric mood.  All other systems reviewed and are negative.       Objective:   Physical Exam Constitutional: She is oriented to person, place, and time. She appears well-developed and well-nourished.  HENT:  Head: Normocephalic.  Neck: Neck supple.  Musculoskeletal: She exhibits tenderness.  Neurological: She is alert and oriented to person, place, and time.  Skin: Skin is warm and dry.  Psychiatric: She has a normal mood and affect.  Symmetric normal motor tone is noted throughout. Normal muscle bulk. Muscle testing reveals 5/5 muscle strength of the upper extremity, and 5/5 of the lower extremity. Full range of motion in upper and lower extremities. ROM of spine is restricted. Fine motor movements are  normal in both hands.  Sensory is intact and symmetric to light touch, pinprick and proprioception.  DTR in the upper and lower extremity are present and symmetric 1+, trace at right patella reflex. No clonus is noted.  Patient arises from chair without difficulty. Narrow based gait with normal arm swing bilateral , able to walk on heels and toes . Tandem walk is stable. No pronator drift. Rhomberg negative.  Good finger to nose and heel to shin testing. No tremor, dystaxia or dysmetria noted        Assessment & Plan:  1. Lumbar post lami syndrome.Hx of Lumbar PSF L4-5, increasing pain with radiation into right anterior thigh, ordered MRI of L-spine.  MRI result :  PSF L4-5 looks satisfactory. L2-3 worsened DDD, stenosis of lateral recess could cause neural compression,  L3-4 worsened DDD, plus 2mm anterolisthesis, multifactorial stenosis, likely to cause neural compression. Referral to neurosurgeon, Dr. Danielle Dess, he scheduled her surgery, PSF L3-4, on April the 28th. Her surgery went well, the radiating Sx into her right thigh have resolved. She is following up with Dr. Danielle Dess, she saw him last month, and he was satisfied with her healing process.  RTC 1 month PA visit, continue oxycodone 5 mg 4 times per day, she has received a prescription from Dr. Danielle Dess post surgery, I gave her a new prescription to be filled when due, her pill count was appropriate. The pain medication was not increased although she had this back surgery done.  Continue Valium 5 mg on a when necessary basis for muscle spasm she takes this less than once a day .  She reports that Dr. Danielle Dess gave a prescription for a rollator, but she bought one , at a place where medicare was not excepted, she needed to have a light weight one, because of her recent PSF, she should not lift too heavy, now she would like to get reimbursed by medicare, and is asking whether we could help her with this, I will talk to [facility], whether there is any  way we can, after she already bought it.  Advised patient to continue with her walking and exercise program, as tolerated, and as it was instructed after her surgery.  Follow up in one month.

## 2013-09-14 NOTE — Progress Notes (Signed)
   Subjective:    Patient ID: Shirley Hamilton, female    DOB: 13-Apr-1936, 77 y.o.   MRN: [id number]  HPI    Review of Systems     Objective:   Physical Exam        Assessment & Plan:

## 2013-09-22 ENCOUNTER — Telehealth: Payer: Self-pay

## 2013-09-22 MED ORDER — DICLOFENAC SODIUM 1 % TD GEL: 2.0000 g | Freq: Four times a day (QID) | TRANSDERMAL | Status: AC

## 2013-09-22 NOTE — Telephone Encounter (Signed)
Patient called requesting an RX for voltaren.  She says she has tried it in the past and it did not seem to help but she is having trouble with pain in her thigh.  Please advise.

## 2013-09-22 NOTE — Telephone Encounter (Signed)
May call in voltaren gel 2g QID, 3 tubes 1 RF If this is not helpful would need MD re eval, it could be back related

## 2013-09-22 NOTE — Telephone Encounter (Signed)
Done and notified.

## 2013-10-06 ENCOUNTER — Other Ambulatory Visit: Payer: Self-pay | Admitting: *Deleted

## 2013-10-06 MED ORDER — DIAZEPAM 5 MG PO TABS: 5.0000 mg | ORAL_TABLET | Freq: Two times a day (BID) | ORAL | Status: DC

## 2013-10-06 MED ORDER — OXYCODONE HCL 5 MG PO TABS: 5.0000 mg | ORAL_TABLET | ORAL | Status: DC | PRN

## 2013-10-06 NOTE — Telephone Encounter (Signed)
RX printed early for controlled medication for the visit with RN on 10/17/13 (to be signed by MD) 

## 2013-10-17 ENCOUNTER — Encounter: Payer: Medicare Other | Attending: Physical Medicine & Rehabilitation | Admitting: *Deleted

## 2013-10-17 ENCOUNTER — Encounter: Payer: Self-pay | Admitting: *Deleted

## 2013-10-17 VITALS — BP 123/44 | HR 71 | Resp 14

## 2013-10-17 DIAGNOSIS — IMO0002 Reserved for concepts with insufficient information to code with codable children: Secondary | ICD-10-CM

## 2013-10-17 DIAGNOSIS — M51379 Other intervertebral disc degeneration, lumbosacral region without mention of lumbar back pain or lower extremity pain: Secondary | ICD-10-CM | POA: Insufficient documentation

## 2013-10-17 DIAGNOSIS — M4316 Spondylolisthesis, lumbar region: Secondary | ICD-10-CM

## 2013-10-17 DIAGNOSIS — M961 Postlaminectomy syndrome, not elsewhere classified: Secondary | ICD-10-CM | POA: Insufficient documentation

## 2013-10-17 DIAGNOSIS — M5137 Other intervertebral disc degeneration, lumbosacral region: Secondary | ICD-10-CM | POA: Insufficient documentation

## 2013-10-17 DIAGNOSIS — M62838 Other muscle spasm: Secondary | ICD-10-CM | POA: Insufficient documentation

## 2013-10-17 DIAGNOSIS — Z981 Arthrodesis status: Secondary | ICD-10-CM | POA: Insufficient documentation

## 2013-10-17 NOTE — Progress Notes (Signed)
Here for pill count and medication refills. Oxycodone 5 mg  # 120  Fill date   09/14/13  Today NV#3  Appropriate.  She feels like she may have built a tolerance and it is not really working any longer.  Refilled oxycodone and valium.  No change in med list.  Pain level 8.  Still having the pain/numbness in her thigh.  Will schedule with Dr Wynn Banker next month to discuss medication situation.

## 2013-10-17 NOTE — Patient Instructions (Addendum)
Follow up one month with Dr Wynn Banker to discuss medication

## 2013-10-19 ENCOUNTER — Other Ambulatory Visit: Payer: Self-pay

## 2013-10-19 MED ORDER — METHOCARBAMOL 500 MG PO TABS: 500.0000 mg | ORAL_TABLET | Freq: Three times a day (TID) | ORAL | Status: DC

## 2013-11-10 ENCOUNTER — Ambulatory Visit
Admission: RE | Admit: 2013-11-10 | Discharge: 2013-11-10 | Disposition: A | Discharge status: Home / Self Care | Payer: Medicare Other | Source: Ambulatory Visit | Attending: Internal Medicine | Admitting: Internal Medicine

## 2013-11-10 ENCOUNTER — Other Ambulatory Visit: Payer: Self-pay | Admitting: Internal Medicine

## 2013-11-10 DIAGNOSIS — R05 Cough: Secondary | ICD-10-CM

## 2013-11-10 DIAGNOSIS — R059 Cough, unspecified: Secondary | ICD-10-CM

## 2013-11-18 ENCOUNTER — Encounter: Payer: Medicare Other | Attending: Physical Medicine and Rehabilitation

## 2013-11-18 ENCOUNTER — Encounter: Payer: Self-pay | Admitting: Physical Medicine & Rehabilitation

## 2013-11-18 ENCOUNTER — Ambulatory Visit (HOSPITAL_BASED_OUTPATIENT_CLINIC_OR_DEPARTMENT_OTHER): Payer: Medicare Other | Admitting: Physical Medicine & Rehabilitation

## 2013-11-18 DIAGNOSIS — M51379 Other intervertebral disc degeneration, lumbosacral region without mention of lumbar back pain or lower extremity pain: Secondary | ICD-10-CM | POA: Insufficient documentation

## 2013-11-18 DIAGNOSIS — IMO0002 Reserved for concepts with insufficient information to code with codable children: Secondary | ICD-10-CM

## 2013-11-18 DIAGNOSIS — M961 Postlaminectomy syndrome, not elsewhere classified: Secondary | ICD-10-CM

## 2013-11-18 DIAGNOSIS — M5137 Other intervertebral disc degeneration, lumbosacral region: Secondary | ICD-10-CM | POA: Insufficient documentation

## 2013-11-18 DIAGNOSIS — M4310 Spondylolisthesis, site unspecified: Secondary | ICD-10-CM

## 2013-11-18 DIAGNOSIS — Z981 Arthrodesis status: Secondary | ICD-10-CM | POA: Insufficient documentation

## 2013-11-18 DIAGNOSIS — M4316 Spondylolisthesis, lumbar region: Secondary | ICD-10-CM

## 2013-11-18 DIAGNOSIS — M62838 Other muscle spasm: Secondary | ICD-10-CM | POA: Insufficient documentation

## 2013-11-18 MED ORDER — OXYCODONE HCL 5 MG PO TABS: 5.0000 mg | ORAL_TABLET | ORAL | Status: DC | PRN

## 2013-11-18 MED ORDER — DIAZEPAM 5 MG PO TABS: 5.0000 mg | ORAL_TABLET | Freq: Two times a day (BID) | ORAL | Status: DC

## 2013-11-18 NOTE — Progress Notes (Signed)
Subjective:    Patient ID: Shirley HeathElsie A Ziesmer, female    DOB: October 23, 1935, 78 y.o.   MRN: [id number]  HPI The patient is a 78 year old female , who presents with chronic LBP . The symptoms started around 10 years ago. The patient complains about increasing moderate to severe pain , which radiate to the right thigh, and in the right posterior hip, which started after her last epidural. Patient also complains about numbness in the right anterior thigh.She describes the pain as aching . Applying heat, taking medications , changing positions alleviate the symptoms. Prolonged standing aggrevates the symptoms. The patient grades her pain as a 8 /10. Hx of PSF in L-spine. The patient reports, that Dr. Shan Levanseveshar changed her from Lexapro to Cymbalta.  I referred her to Dr. Danielle DessElsner, who scheduled surgery, PSF L3-4 on April the 28th. The surgery went well, her radiating Sx into her right have decreased. She is doing considerably well. She is following up with Dr. Danielle DessElsner  Pain Inventory Average Pain 8 Pain Right Now 7 My pain is sharp, dull, stabbing and tingling  In the last 24 hours, has pain interfered with the following? General activity 0 Relation with others 0 Enjoyment of life 0 What TIME of day is your pain at its worst? morning evening and night Sleep (in general) Fair  Pain is worse with: bending, sitting and standing Pain improves with: rest and medication Relief from Meds: 6  Mobility walk without assistance  Function retired  Neuro/Psych numbness tingling  Prior Studies Any changes since last visit?  no  Physicians involved in your care Any changes since last visit?  no   History reviewed. No pertinent family history. History   Social History  . Marital Status: Married    Spouse Name: N/A    Number of Children: N/A  . Years of Education: N/A   Social History Main Topics  . Smoking status: Never Smoker   . Smokeless tobacco: Never Used  . Alcohol Use: Yes     Comment:  socially  . Drug Use: No  . Sexual Activity: None   Other Topics Concern  . None   Social History Narrative  . None   Past Surgical History  Procedure Laterality Date  . Spine surgery    . Knee surgery      right  . Cataract extraction, bilateral Bilateral    Past Medical History  Diagnosis Date  . Neuromuscular disorder   . Anxiety   . Mental disorder   . Arthritis   . Headache(784.0)   . Cataract     left and right   There were no vitals taken for this visit.  Opioid Risk Score:   Fall Risk Score: Moderate Fall Risk (6-13 points) (educated and fall prevention information given) Review of Systems  Neurological: Positive for numbness.       Tingling  All other systems reviewed and are negative.       Objective:   Physical Exam  Nursing note and vitals reviewed. Constitutional: She is oriented to person, place, and time. She appears well-developed and well-nourished.  HENT:  Head: Normocephalic and atraumatic.  Eyes: Conjunctivae and EOM are normal. Pupils are equal, round, and reactive to light.  Neck: Normal range of motion.  Neurological: She is alert and oriented to person, place, and time. She has normal strength.  Reflex Scores:      Patellar reflexes are 1+ on the right side and 2+ on the left side.  Achilles reflexes are 0 on the right side and 0 on the left side. Psychiatric: She has a normal mood and affect.          Assessment & Plan:  1. 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 Some improvement after surgery about 9 months ago. Continues to require narcotic analgesics as well as muscle relaxers and gabapentin. Continue oxycodone 10 mg 4 times a day Continue Valium 5 mg 2 times a day Gabapentin 3 times a day

## 2013-11-18 NOTE — Patient Instructions (Signed)
Prescription for diabetic shoe

## 2013-12-12 ENCOUNTER — Other Ambulatory Visit: Payer: Self-pay | Admitting: *Deleted

## 2013-12-12 MED ORDER — OXYCODONE HCL 5 MG PO TABS: 5.0000 mg | ORAL_TABLET | ORAL | Status: DC | PRN

## 2013-12-12 NOTE — Telephone Encounter (Signed)
RX printed early for controlled medication for the visit with RN on 12/14/13 (to be signed by MD) 

## 2013-12-14 ENCOUNTER — Encounter: Payer: Self-pay | Admitting: *Deleted

## 2013-12-14 ENCOUNTER — Encounter: Payer: Medicare Other | Attending: Physical Medicine & Rehabilitation | Admitting: *Deleted

## 2013-12-14 VITALS — BP 102/42 | HR 66 | Resp 14

## 2013-12-14 DIAGNOSIS — M545 Low back pain, unspecified: Secondary | ICD-10-CM | POA: Insufficient documentation

## 2013-12-14 DIAGNOSIS — M961 Postlaminectomy syndrome, not elsewhere classified: Secondary | ICD-10-CM

## 2013-12-14 DIAGNOSIS — G8929 Other chronic pain: Secondary | ICD-10-CM | POA: Insufficient documentation

## 2013-12-14 DIAGNOSIS — F489 Nonpsychotic mental disorder, unspecified: Secondary | ICD-10-CM | POA: Insufficient documentation

## 2013-12-14 DIAGNOSIS — F411 Generalized anxiety disorder: Secondary | ICD-10-CM | POA: Insufficient documentation

## 2013-12-14 DIAGNOSIS — M4316 Spondylolisthesis, lumbar region: Secondary | ICD-10-CM

## 2013-12-14 DIAGNOSIS — G709 Myoneural disorder, unspecified: Secondary | ICD-10-CM | POA: Insufficient documentation

## 2013-12-14 DIAGNOSIS — IMO0002 Reserved for concepts with insufficient information to code with codable children: Secondary | ICD-10-CM

## 2013-12-14 NOTE — Patient Instructions (Signed)
Follow up one month with RN for med refill 

## 2013-12-14 NOTE — Progress Notes (Signed)
Here for pill count and medication refills. Oxycodone 5 mg # 120 Fill date 11/18/13  Today NV# 10  Refill given,  She is not able to get [facility]axin [facility].  I will ask Dr Wynn BankerKirsteins if he wants to prescribe something its place.Has not had any falls. Educated and handout given about home fall prevention. Moderate fall risk.  Return to see RN for med refill next month.

## 2013-12-16 ENCOUNTER — Telehealth: Payer: Self-pay | Admitting: *Deleted

## 2013-12-16 NOTE — Telephone Encounter (Signed)
Shirley Hamilton saw me on Wednesday and her insurance denied the robaxin she has been on for a long time.  She was wanting to know if she can have anything else.  I don't think she has tried tizanidine which most have on their formularies now. Please advise.

## 2013-12-16 NOTE — Telephone Encounter (Signed)
May try tizanidine 2mg  po TID prn, #90 1 RF

## 2013-12-19 MED ORDER — TIZANIDINE HCL 2 MG PO TABS: 2.0000 mg | ORAL_TABLET | Freq: Three times a day (TID) | ORAL | Status: DC | PRN

## 2013-12-19 NOTE — Telephone Encounter (Signed)
New order for tizanidine called to pharmacy and Shirley Hamilton was notified.

## 2014-01-03 ENCOUNTER — Other Ambulatory Visit: Payer: Self-pay | Admitting: *Deleted

## 2014-01-03 MED ORDER — DIAZEPAM 5 MG PO TABS: 5.0000 mg | ORAL_TABLET | Freq: Two times a day (BID) | ORAL | Status: DC

## 2014-01-03 MED ORDER — OXYCODONE HCL 5 MG PO TABS: 5.0000 mg | ORAL_TABLET | ORAL | Status: DC | PRN

## 2014-01-03 NOTE — Telephone Encounter (Signed)
RX printed for MD to sign for RN visit 01/09/14 

## 2014-02-06 ENCOUNTER — Encounter: Payer: Self-pay | Admitting: Registered Nurse

## 2014-02-06 ENCOUNTER — Encounter: Payer: Medicare Other | Attending: Physical Medicine & Rehabilitation | Admitting: Registered Nurse

## 2014-02-06 VITALS — BP 132/72 | HR 64 | Resp 14 | Wt 166.8 lb

## 2014-02-06 DIAGNOSIS — M961 Postlaminectomy syndrome, not elsewhere classified: Secondary | ICD-10-CM | POA: Insufficient documentation

## 2014-02-06 DIAGNOSIS — M4316 Spondylolisthesis, lumbar region: Secondary | ICD-10-CM

## 2014-02-06 DIAGNOSIS — M545 Low back pain, unspecified: Secondary | ICD-10-CM | POA: Insufficient documentation

## 2014-02-06 DIAGNOSIS — IMO0002 Reserved for concepts with insufficient information to code with codable children: Secondary | ICD-10-CM

## 2014-02-06 DIAGNOSIS — G709 Myoneural disorder, unspecified: Secondary | ICD-10-CM | POA: Insufficient documentation

## 2014-02-06 DIAGNOSIS — Z5181 Encounter for therapeutic drug level monitoring: Secondary | ICD-10-CM

## 2014-02-06 DIAGNOSIS — Z79899 Other long term (current) drug therapy: Secondary | ICD-10-CM

## 2014-02-06 DIAGNOSIS — M4310 Spondylolisthesis, site unspecified: Secondary | ICD-10-CM

## 2014-02-06 DIAGNOSIS — G8929 Other chronic pain: Secondary | ICD-10-CM | POA: Insufficient documentation

## 2014-02-06 DIAGNOSIS — F489 Nonpsychotic mental disorder, unspecified: Secondary | ICD-10-CM | POA: Insufficient documentation

## 2014-02-06 DIAGNOSIS — F411 Generalized anxiety disorder: Secondary | ICD-10-CM | POA: Insufficient documentation

## 2014-02-06 MED ORDER — OXYCODONE HCL 5 MG PO TABS: 5.0000 mg | ORAL_TABLET | ORAL | Status: DC | PRN
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
## 2014-03-08 ENCOUNTER — Other Ambulatory Visit: Payer: Self-pay

## 2014-03-08 ENCOUNTER — Encounter: Payer: Medicare Other | Admitting: Registered Nurse

## 2014-03-08 MED ORDER — OXYCODONE HCL 5 MG PO TABS: 5.0000 mg | ORAL_TABLET | ORAL | Status: DC | PRN
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
## 2014-04-05 ENCOUNTER — Other Ambulatory Visit: Payer: Self-pay

## 2014-04-05 ENCOUNTER — Encounter: Payer: Medicare Other | Admitting: Registered Nurse

## 2014-04-05 MED ORDER — OXYCODONE HCL 5 MG PO TABS: 5.0000 mg | ORAL_TABLET | ORAL | Status: DC | PRN
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
## 2014-04-10 ENCOUNTER — Other Ambulatory Visit: Payer: Self-pay

## 2014-04-10 MED ORDER — DIAZEPAM 5 MG PO TABS: 5.0000 mg | ORAL_TABLET | Freq: Two times a day (BID) | ORAL | Status: DC

## 2014-04-10 NOTE — Telephone Encounter (Signed)
Pharmacy requested received for authorization to refill patient's Diazepam. Ref[facility] at [facility].

## 2014-04-15 IMAGING — CR DG CHEST 2V
2 series · 2 of 2 positions shown · non-contrast
Comparison: 05/18/2008

CLINICAL DATA: Cough, shortness of breath

EXAM:
CHEST  2 VIEW

[w chest pa]
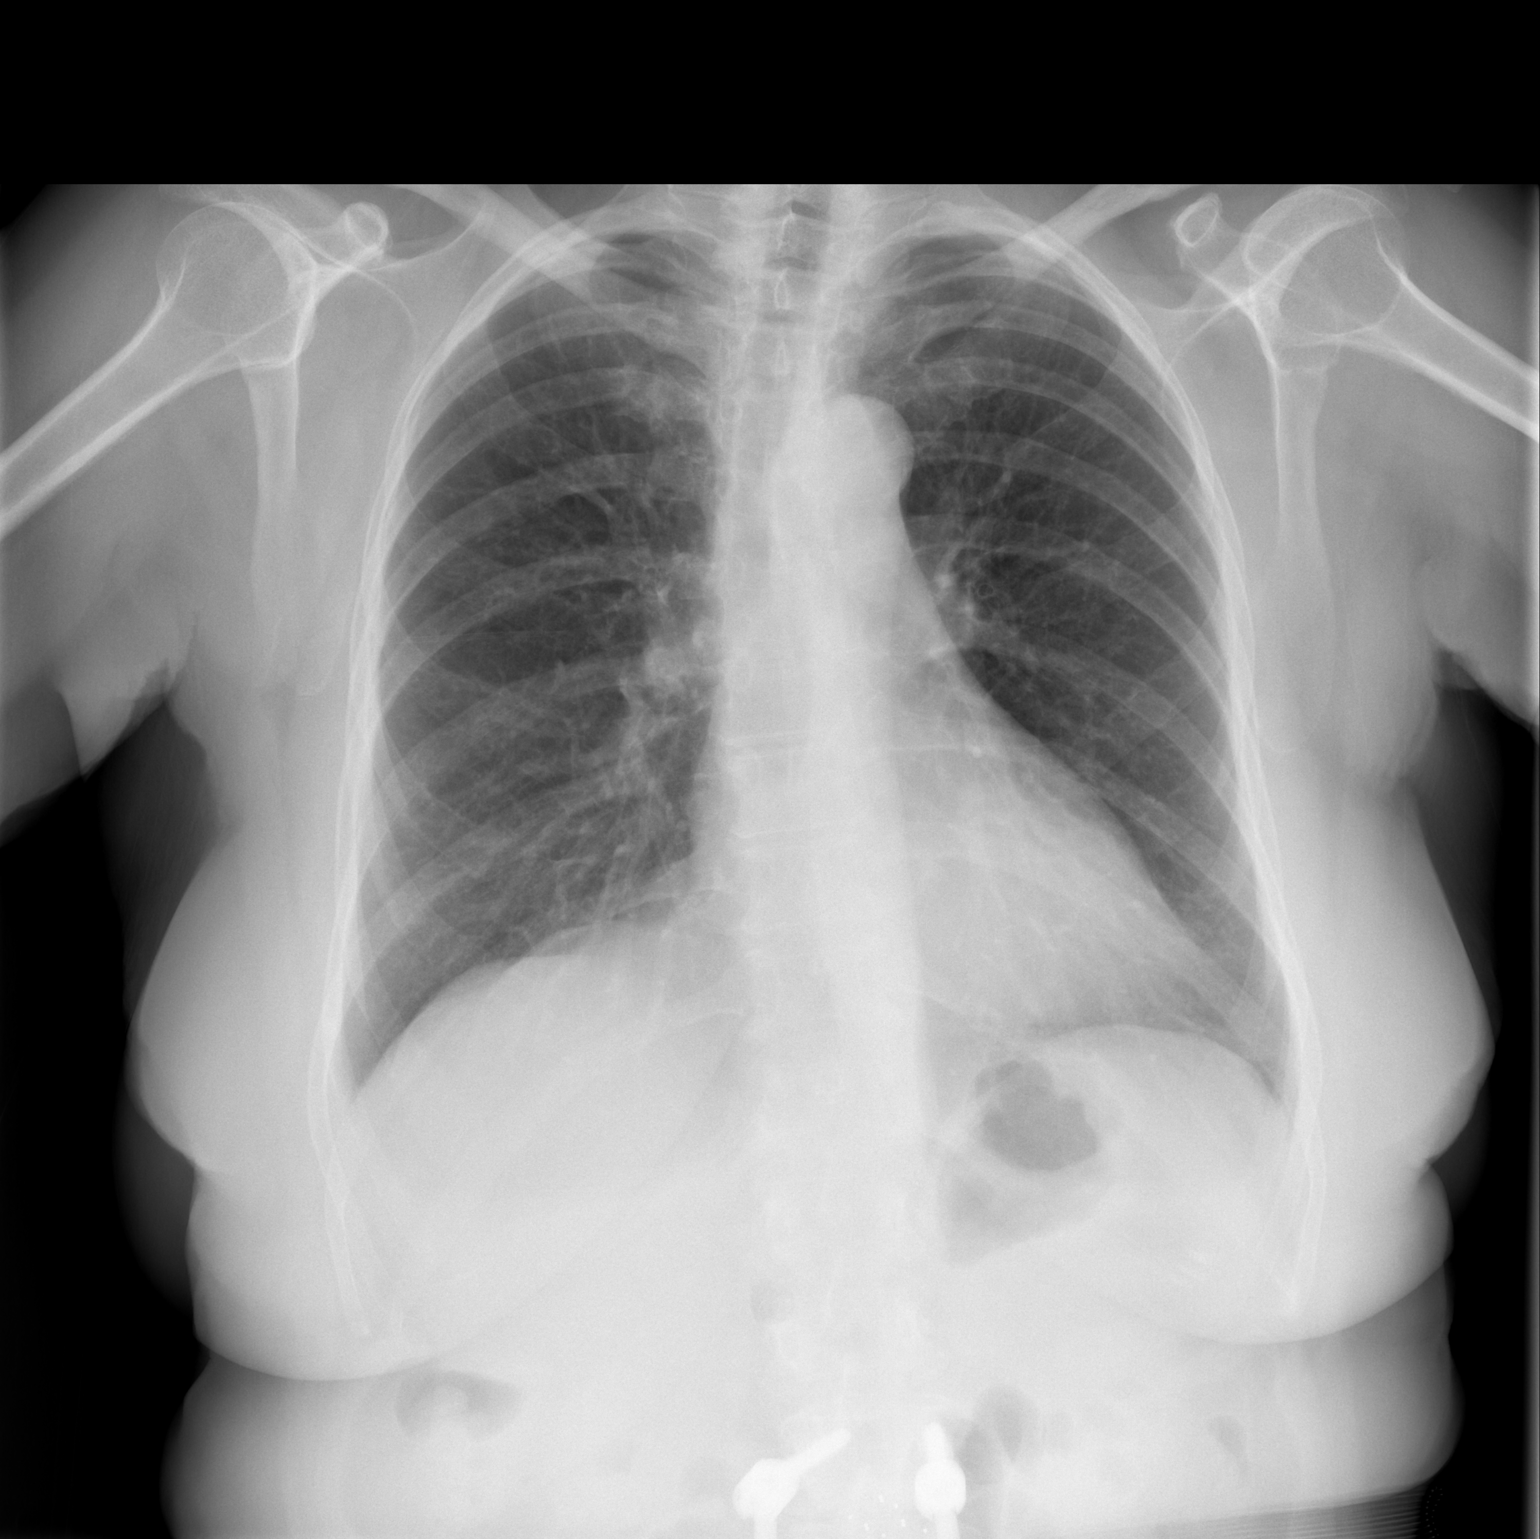

[w chest lat]
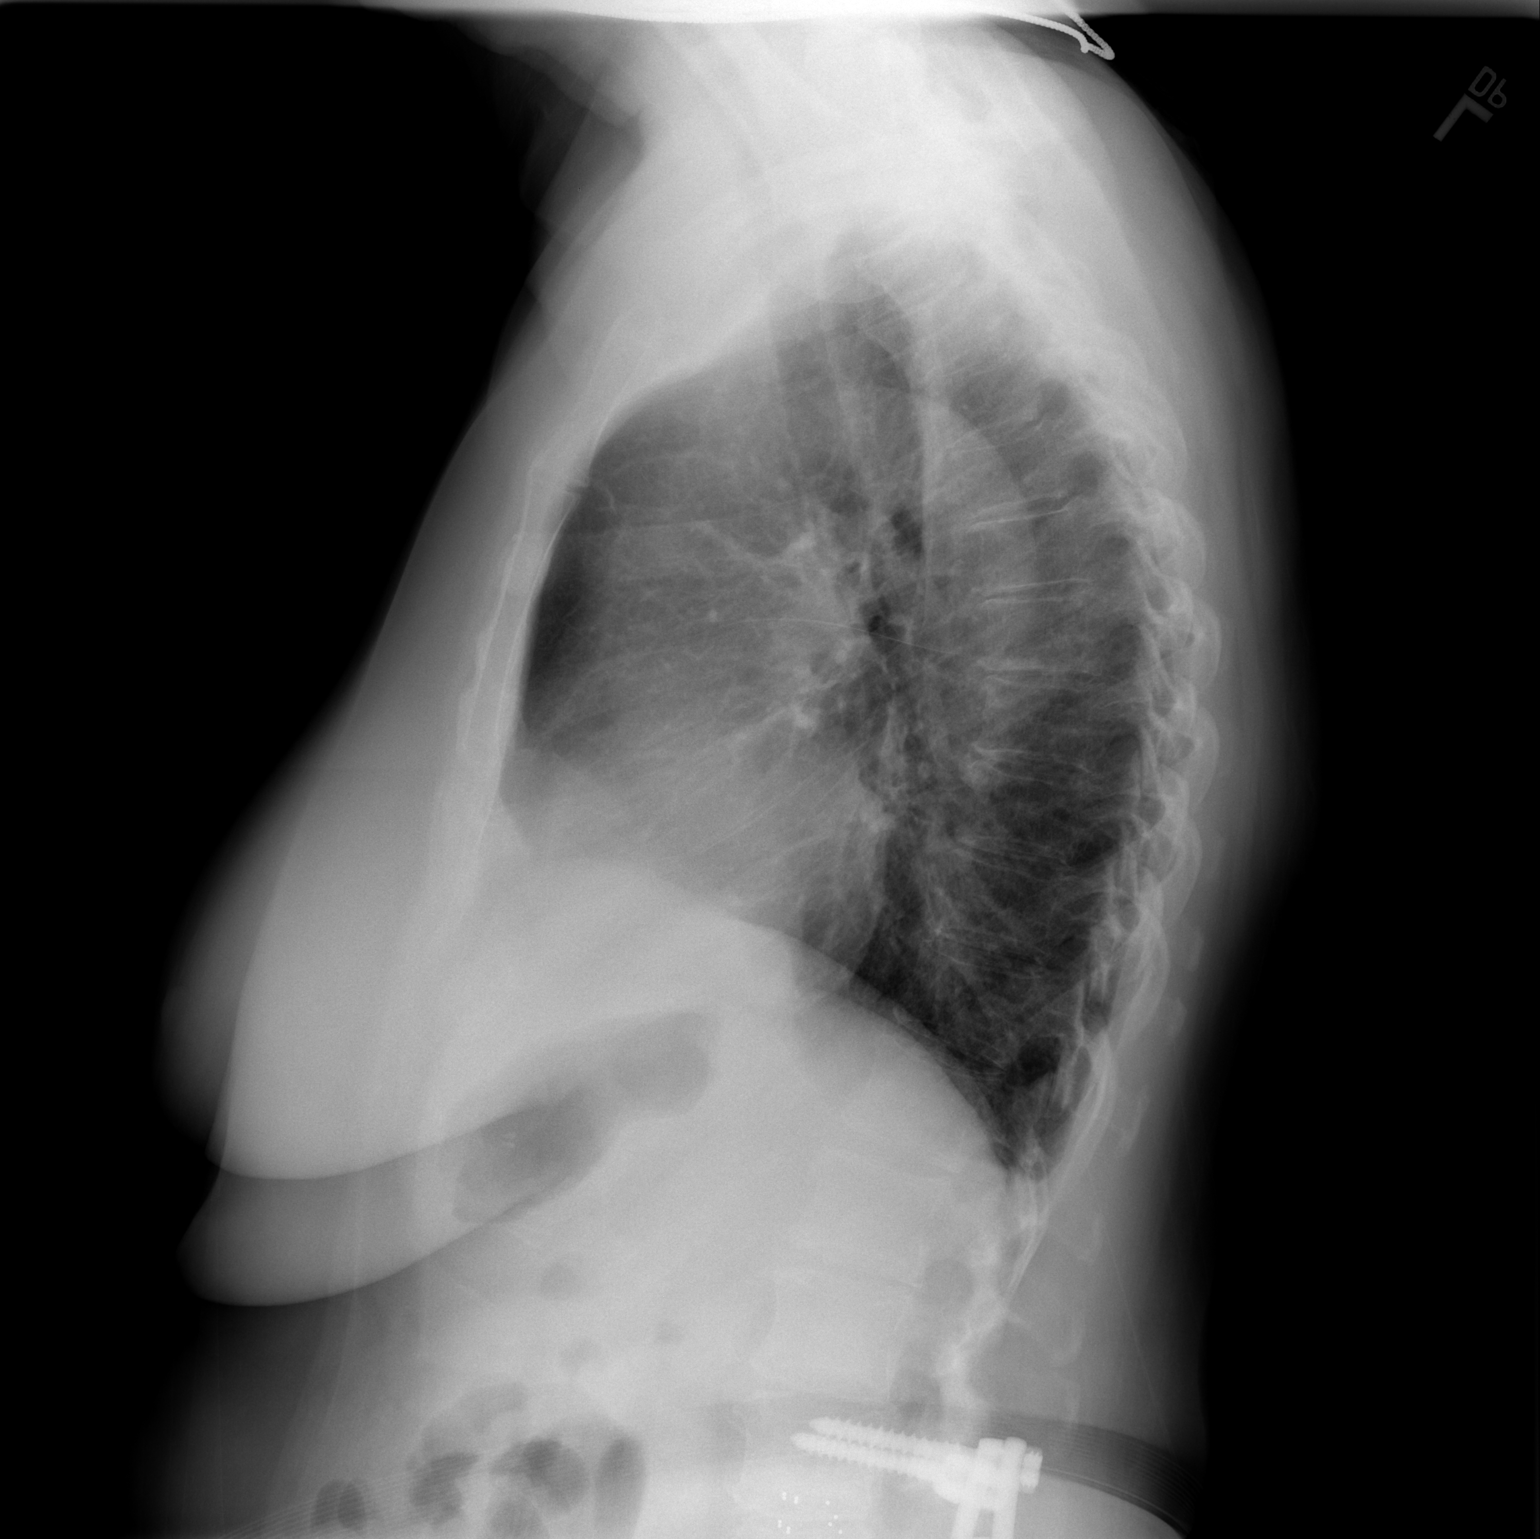

[2 of 2 positions shown; findings below may reference images not displayed]

FINDINGS: Lungs are clear. No pleural effusion or pneumothorax.

The heart is normal in size.

Mild degenerative changes of the visualized thoracolumbar spine.
Lumbar spine fixation hardware, incompletely visualized.
IMPRESSION: No evidence of acute cardiopulmonary disease.

## 2014-05-05 ENCOUNTER — Encounter: Payer: Self-pay | Admitting: Registered Nurse

## 2014-05-05 ENCOUNTER — Encounter: Payer: Medicare Other | Attending: Physical Medicine & Rehabilitation | Admitting: Registered Nurse

## 2014-05-05 VITALS — BP 109/68 | HR 70 | Resp 14 | Wt 162.0 lb

## 2014-05-05 DIAGNOSIS — IMO0002 Reserved for concepts with insufficient information to code with codable children: Secondary | ICD-10-CM

## 2014-05-05 DIAGNOSIS — F489 Nonpsychotic mental disorder, unspecified: Secondary | ICD-10-CM | POA: Insufficient documentation

## 2014-05-05 DIAGNOSIS — Z5181 Encounter for therapeutic drug level monitoring: Secondary | ICD-10-CM

## 2014-05-05 DIAGNOSIS — M545 Low back pain, unspecified: Secondary | ICD-10-CM | POA: Insufficient documentation

## 2014-05-05 DIAGNOSIS — G8929 Other chronic pain: Secondary | ICD-10-CM | POA: Diagnosis not present

## 2014-05-05 DIAGNOSIS — M4316 Spondylolisthesis, lumbar region: Secondary | ICD-10-CM

## 2014-05-05 DIAGNOSIS — F411 Generalized anxiety disorder: Secondary | ICD-10-CM | POA: Diagnosis not present

## 2014-05-05 DIAGNOSIS — M961 Postlaminectomy syndrome, not elsewhere classified: Secondary | ICD-10-CM

## 2014-05-05 DIAGNOSIS — M4310 Spondylolisthesis, site unspecified: Secondary | ICD-10-CM

## 2014-05-05 DIAGNOSIS — Z79899 Other long term (current) drug therapy: Secondary | ICD-10-CM

## 2014-05-05 DIAGNOSIS — G709 Myoneural disorder, unspecified: Secondary | ICD-10-CM | POA: Diagnosis not present

## 2014-05-05 MED ORDER — OXYCODONE HCL 5 MG PO TABS: 5.0000 mg | ORAL_TABLET | ORAL | Status: DC | PRN

## 2014-05-05 NOTE — Progress Notes (Signed)
Subjective:    Patient ID: Shirley Hamilton, female    DOB: January 16, 1936, 78 y.o.   MRN: [id number]  HPI: Shirley Hamilton is a 78 year old female who returns For follow up for chronic pain and medication refill, She says her pain is lower back. She rates her pain 7. Her current exercise reigme is walking. She says her husband is under hospice care, he's at the [facility] He has been diagnosed with Giloblastoma. Emotional support given.    Pain Inventory Average Pain 8 Pain Right Now 7 My pain is constant, sharp, stabbing, aching and numbness  In the last 24 hours, has pain interfered with the following? General activity 8 Relation with others 6 Enjoyment of life 6 What TIME of day is your pain at its worst? morning daytime and night Sleep (in general) Poor  Pain is worse with: standing and some activites Pain improves with: rest, pacing activities and medication Relief from Meds: 5  Mobility walk without assistance how many minutes can you walk? 15-30 ability to climb steps?  yes do you drive?  yes  Function retired  Neuro/Psych tingling spasms  Prior Studies Any changes since last visit?  no  Physicians involved in your care Any changes since last visit?  no   History reviewed. No pertinent family history. History   Social History  . Marital Status: Married    Spouse Name: N/A    Number of Children: N/A  . Years of Education: N/A   Social History Main Topics  . Smoking status: Never Smoker   . Smokeless tobacco: Never Used  . Alcohol Use: Yes     Comment: socially  . Drug Use: No  . Sexual Activity: None   Other Topics Concern  . None   Social History Narrative  . None   Past Surgical History  Procedure Laterality Date  . Spine surgery    . Knee surgery      right  . Cataract extraction, bilateral Bilateral    Past Medical History  Diagnosis Date  . Neuromuscular disorder   . Anxiety   . Mental disorder   . Arthritis   .  Headache(784.0)   . Cataract     left and right   BP 109/68  Pulse 70  Resp 14  Wt 162 lb (73.483 kg)  SpO2 95%  Opioid Risk Score:   Fall Risk Score: Moderate Fall Risk (6-13 points) (previously educated and given handout on fall prevention in the home) Review of Systems  Musculoskeletal:       Spasms  Neurological:       Tingling  All other systems reviewed and are negative.      Objective:   Physical Exam  Nursing note and vitals reviewed. Constitutional: She is oriented to person, place, and time. She appears well-developed and well-nourished.  HENT:  Head: Normocephalic and atraumatic.  Neck: Normal range of motion. Neck supple.  Cardiovascular: Normal rate and regular rhythm.   Pulmonary/Chest: Effort normal and breath sounds normal.  Musculoskeletal:  Normal Muscle Bulk and Muscle Testing Reveals: Upper Extremities: Full ROM and Muscle Strength 5/5 Spinal Forward Flexion: 90 Degrees and Extension: 30 Degrees Lower Extremities: Full ROM and Muscle Strength 5/5 Arises from chair with ease Narrow Based gait  Neurological: She is alert and oriented to person, place, and time.  Skin: Skin is warm and dry.  Psychiatric: She has a normal mood and affect.  Assessment & Plan:  1. 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  Refilled:oxyCODONE 5mg  one tablet every 4 hours as needed #120. Alternate heat and ice therapy. Continue to use Voltaren gel and gabapentin   F/U in 1 month

## 2014-06-06 ENCOUNTER — Encounter: Payer: Self-pay | Admitting: Registered Nurse

## 2014-06-06 ENCOUNTER — Encounter: Payer: Medicare Other | Attending: Physical Medicine & Rehabilitation | Admitting: Registered Nurse

## 2014-06-06 VITALS — BP 107/69 | HR 79 | Resp 14 | Ht 64.0 in | Wt 158.0 lb

## 2014-06-06 DIAGNOSIS — G8929 Other chronic pain: Secondary | ICD-10-CM | POA: Diagnosis not present

## 2014-06-06 DIAGNOSIS — M545 Low back pain, unspecified: Secondary | ICD-10-CM | POA: Insufficient documentation

## 2014-06-06 DIAGNOSIS — F489 Nonpsychotic mental disorder, unspecified: Secondary | ICD-10-CM | POA: Insufficient documentation

## 2014-06-06 DIAGNOSIS — M4316 Spondylolisthesis, lumbar region: Secondary | ICD-10-CM

## 2014-06-06 DIAGNOSIS — G709 Myoneural disorder, unspecified: Secondary | ICD-10-CM | POA: Insufficient documentation

## 2014-06-06 DIAGNOSIS — IMO0002 Reserved for concepts with insufficient information to code with codable children: Secondary | ICD-10-CM

## 2014-06-06 DIAGNOSIS — M4310 Spondylolisthesis, site unspecified: Secondary | ICD-10-CM

## 2014-06-06 DIAGNOSIS — Z5181 Encounter for therapeutic drug level monitoring: Secondary | ICD-10-CM

## 2014-06-06 DIAGNOSIS — M961 Postlaminectomy syndrome, not elsewhere classified: Secondary | ICD-10-CM | POA: Insufficient documentation

## 2014-06-06 DIAGNOSIS — F411 Generalized anxiety disorder: Secondary | ICD-10-CM | POA: Diagnosis not present

## 2014-06-06 DIAGNOSIS — Z79899 Other long term (current) drug therapy: Secondary | ICD-10-CM

## 2014-06-06 MED ORDER — OXYCODONE HCL 5 MG PO TABS: 5.0000 mg | ORAL_TABLET | ORAL | Status: DC | PRN

## 2014-06-06 NOTE — Progress Notes (Signed)
Subjective:    Patient ID: Shirley Hamilton, female    DOB: 12-Sep-1936, 78 y.o.   MRN: [id number]  HPI: [name] is a 78 year old female who returns For follow up for chronic pain and medication refill, She says her pain is lower back and right thigh. She rates her pain 8. Her current exercise reigme is walking.  She says her husband passed away and emotional support given.   Pain Inventory Average Pain 7 Pain Right Now 8 My pain is sharp, dull, stabbing and tingling  In the last 24 hours, has pain interfered with the following? General activity 6 Relation with others 6 Enjoyment of life 6 What TIME of day is your pain at its worst? morning, evening Sleep (in general) Poor  Pain is worse with: bending, sitting and standing Pain improves with: rest and medication Relief from Meds: 6  Mobility walk without assistance how many minutes can you walk? 15 ability to climb steps?  yes do you drive?  yes  Function retired  Neuro/Psych weakness numbness tingling spasms depression anxiety  Prior Studies Any changes since last visit?  no  Physicians involved in your care Any changes since last visit?  no   History reviewed. No pertinent family history. History   Social History  . Marital Status: Married    Spouse Name: N/A    Number of Children: N/A  . Years of Education: N/A   Social History Main Topics  . Smoking status: Never Smoker   . Smokeless tobacco: Never Used  . Alcohol Use: Yes     Comment: socially  . Drug Use: No  . Sexual Activity: None   Other Topics Concern  . None   Social History Narrative  . None   Past Surgical History  Procedure Laterality Date  . Spine surgery    . Knee surgery      right  . Cataract extraction, bilateral Bilateral    Past Medical History  Diagnosis Date  . Neuromuscular disorder   . Anxiety   . Mental disorder   . Arthritis   . Headache(784.0)   . Cataract     left and right   Pulse 79  Resp  14  Ht 5\' 4"  (1.626 m)  Wt 158 lb (71.668 kg)  BMI 27.11 kg/m2  SpO2 92%  Opioid Risk Score:   Fall Risk Score: Moderate Fall Risk (6-13 points) (pt educated, declined brochure)    Review of Systems  Musculoskeletal: Positive for back pain.  Neurological: Positive for weakness and numbness.       Tingling, spasms  Psychiatric/Behavioral: The patient is nervous/anxious.        Depression  All other systems reviewed and are negative.      Objective:   Physical Exam  Nursing note and vitals reviewed. Constitutional: She is oriented to person, place, and time. She appears well-developed and well-nourished.  HENT:  Head: Normocephalic and atraumatic.  Neck: Normal range of motion. Neck supple.  Cardiovascular: Normal rate and regular rhythm.   Pulmonary/Chest: Effort normal and breath sounds normal.  Musculoskeletal:  Normal Muscle Bulk and Muscle testing Reveals: Upper Extremities: Full ROM and Muscle strength 5/5 Thoracic and Lumbar Hypersensitivity Lower Extremities: Full ROM and Muscle strength 5/5 Arises from chair with ease Narrow Based Gait  Neurological: She is alert and oriented to person, place, and time.  Skin: Skin is warm and dry.  Psychiatric: She has a normal mood and affect.  Assessment & Plan:  1. 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  Refilled:oxyCODONE 5mg  one tablet every 4 hours as needed #120. Alternate heat and ice therapy. Continue to use Voltaren gel and gabapentin   F/U in 1 month

## 2014-06-09 ENCOUNTER — Other Ambulatory Visit: Payer: Self-pay

## 2014-06-09 MED ORDER — GABAPENTIN 300 MG PO CAPS: 300.0000 mg | ORAL_CAPSULE | Freq: Three times a day (TID) | ORAL | Status: DC

## 2014-07-03 ENCOUNTER — Encounter: Payer: Medicare Other | Attending: Physical Medicine & Rehabilitation | Admitting: Registered Nurse

## 2014-07-03 ENCOUNTER — Encounter: Payer: Self-pay | Admitting: Registered Nurse

## 2014-07-03 VITALS — BP 108/63 | HR 75 | Resp 16 | Ht 64.0 in | Wt 165.0 lb

## 2014-07-03 DIAGNOSIS — M545 Low back pain, unspecified: Secondary | ICD-10-CM | POA: Diagnosis present

## 2014-07-03 DIAGNOSIS — Z5181 Encounter for therapeutic drug level monitoring: Secondary | ICD-10-CM

## 2014-07-03 DIAGNOSIS — M961 Postlaminectomy syndrome, not elsewhere classified: Secondary | ICD-10-CM

## 2014-07-03 DIAGNOSIS — F411 Generalized anxiety disorder: Secondary | ICD-10-CM | POA: Diagnosis not present

## 2014-07-03 DIAGNOSIS — M4310 Spondylolisthesis, site unspecified: Secondary | ICD-10-CM

## 2014-07-03 DIAGNOSIS — G8929 Other chronic pain: Secondary | ICD-10-CM | POA: Insufficient documentation

## 2014-07-03 DIAGNOSIS — F489 Nonpsychotic mental disorder, unspecified: Secondary | ICD-10-CM | POA: Diagnosis not present

## 2014-07-03 DIAGNOSIS — G709 Myoneural disorder, unspecified: Secondary | ICD-10-CM | POA: Diagnosis not present

## 2014-07-03 DIAGNOSIS — Z79899 Other long term (current) drug therapy: Secondary | ICD-10-CM

## 2014-07-03 MED ORDER — OXYCODONE HCL 5 MG PO TABS: 5.0000 mg | ORAL_TABLET | ORAL | Status: DC | PRN

## 2014-07-03 NOTE — Progress Notes (Signed)
Subjective:    Patient ID: Shirley Hamilton, female    DOB: Aug 02, 1936, 78 y.o.   MRN: [id number]  HPI: [name] is a 78 year old female who returns For follow up for chronic pain and medication refill, She says her pain is located in her lower back and right thigh. She says at night she usually has numbness and stabbing pain in her thigh occassionally. She rates her pain 7. Her current exercise reigme is walking.   Pain Inventory Average Pain 7 Pain Right Now 7 My pain is sharp, dull, stabbing and tingling  In the last 24 hours, has pain interfered with the following? General activity 2 Relation with others 2 Enjoyment of life 3 What TIME of day is your pain at its worst? daytime,night Sleep (in general) Fair  Pain is worse with: bending, sitting and standing Pain improves with: rest, therapy/exercise and medication Relief from Meds: na  Mobility walk without assistance how many minutes can you walk? 15-30 ability to climb steps?  yes do you drive?  yes transfers alone Do you have any goals in this area?  yes  Function retired  Neuro/Psych numbness tingling spasms depression anxiety  Prior Studies Any changes since last visit?  no  Physicians involved in your care Any changes since last visit?  no   History reviewed. No pertinent family history. History   Social History  . Marital Status: Married    Spouse Name: N/A    Number of Children: N/A  . Years of Education: N/A   Social History Main Topics  . Smoking status: Never Smoker   . Smokeless tobacco: Never Used  . Alcohol Use: Yes     Comment: socially  . Drug Use: No  . Sexual Activity: None   Other Topics Concern  . None   Social History Narrative  . None   Past Surgical History  Procedure Laterality Date  . Spine surgery    . Knee surgery      right  . Cataract extraction, bilateral Bilateral    Past Medical History  Diagnosis Date  . Neuromuscular disorder   . Anxiety     . Mental disorder   . Arthritis   . Headache(784.0)   . Cataract     left and right   BP 108/63  Pulse 75  Resp 16  Ht  (1.626 m)  Wt 165 lb (74.844 kg)  BMI 28.31 kg/m2  SpO2 96%  Opioid Risk Score:   Fall Risk Score: Moderate Fall Risk (6-13 points) (pt educated,declined handout)   Review of Systems  Neurological: Positive for numbness.       Tingling,spasms  Psychiatric/Behavioral: The patient is nervous/anxious.        Depression  All other systems reviewed and are negative.      Objective:   Physical Exam  Nursing note and vitals reviewed. Constitutional: She is oriented to person, place, and time. She appears well-developed and well-nourished.  HENT:  Head: Normocephalic and atraumatic.  Neck: Normal range of motion. Neck supple.  Cardiovascular: Normal rate and regular rhythm.   Pulmonary/Chest: Effort normal and breath sounds normal.  Musculoskeletal:  Normal Muscle Bulk and Muscle testing Reveals: Upper Extremities: Full ROM and Muscle Strength 5/5 Back without spinal or paraspinal tenderness Lower Extremities: Full ROM and Muscle Strength 5/5 Arises from chair with ease Narrow Based Gait  Neurological: She is alert and oriented to person, place, and time.  Skin: Skin is warm and  dry.  Psychiatric: She has a normal mood and affect.          Assessment & Plan:  1. 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  Refilled:oxyCODONE  one tablet every 4 hours as needed #120. Alternate heat and ice therapy. Continue to use Voltaren gel and gabapentin  20 minutes of face to face patient care time was spent during this visit. All questions were encouraged and answered.  F/U in 1 month

## 2014-08-01 ENCOUNTER — Encounter: Payer: Medicare Other | Attending: Physical Medicine & Rehabilitation

## 2014-08-01 ENCOUNTER — Encounter: Payer: Self-pay | Admitting: Physical Medicine & Rehabilitation

## 2014-08-01 ENCOUNTER — Ambulatory Visit (HOSPITAL_BASED_OUTPATIENT_CLINIC_OR_DEPARTMENT_OTHER): Payer: Medicare Other | Admitting: Physical Medicine & Rehabilitation

## 2014-08-01 VITALS — BP 122/73 | HR 70 | Resp 14 | Ht 64.0 in | Wt 164.4 lb

## 2014-08-01 DIAGNOSIS — M5416 Radiculopathy, lumbar region: Secondary | ICD-10-CM

## 2014-08-01 DIAGNOSIS — M5417 Radiculopathy, lumbosacral region: Secondary | ICD-10-CM | POA: Diagnosis present

## 2014-08-01 DIAGNOSIS — M961 Postlaminectomy syndrome, not elsewhere classified: Secondary | ICD-10-CM

## 2014-08-01 MED ORDER — OXYCODONE HCL 5 MG PO TABS: 5.0000 mg | ORAL_TABLET | ORAL | Status: DC | PRN

## 2014-08-01 MED ORDER — DIAZEPAM 5 MG PO TABS: 5.0000 mg | ORAL_TABLET | Freq: Two times a day (BID) | ORAL | Status: DC

## 2014-08-01 NOTE — Patient Instructions (Signed)
Last diazepam refills, based on your age we'll need to get you off this medication since it increases fall risk especially in combination with oxycodone

## 2014-08-01 NOTE — Progress Notes (Signed)
Subjective:    Patient ID: Shirley Hamilton, female    DOB: 12-Aug-1936, 78 y.o.   MRN: [id number]  HPI 78 year old female , who presents with chronic LBP . The symptoms started around 10 years ago. The patient complains about increasing moderate to severe pain , which radiate to the right thigh, and in the right posterior hip, which started after her last epidural. Patient also complains about numbness in the right anterior thigh.She describes the pain as aching . Applying heat, taking medications , changing positions alleviate the symptoms. Prolonged standing aggrevates the symptoms. The patient grades her pain as a 8 /10. Hx of PSF in L-spine. The patient reports, that Dr. Shan Levans changed her from Lexapro to Cymbalta.  Patient is upset her husband passed away after a long illness both with Parkinson's disease as well as with glioblastoma. Patient had not been taking diazepam for several months but restarted this recently Living in a apartment now. Pain Inventory Average Pain 7 Pain Right Now 7 My pain is constant, sharp, dull, stabbing, tingling and aching  In the last 24 hours, has pain interfered with the following? General activity 6 Relation with others 4 Enjoyment of life 4 What TIME of day is your pain at its worst? daytime, evening and night Sleep (in general) Fair  Pain is worse with: bending, sitting and standing Pain improves with: rest, pacing activities and medication Relief from Meds: 7  Mobility walk without assistance how many minutes can you walk? 15 minutes ability to climb steps?  yes do you drive?  yes  Function retired  Neuro/Psych tingling spasms depression anxiety  Prior Studies Any changes since last visit?  no  Physicians involved in your care Any changes since last visit?  no   History reviewed. No pertinent family history. History   Social History  . Marital Status: Married    Spouse Name: N/A    Number of Children: N/A  . Years of  Education: N/A   Social History Main Topics  . Smoking status: Never Smoker   . Smokeless tobacco: Never Used  . Alcohol Use: Yes     Comment: socially  . Drug Use: No  . Sexual Activity: None   Other Topics Concern  . None   Social History Narrative  . None   Past Surgical History  Procedure Laterality Date  . Spine surgery    . Knee surgery      right  . Cataract extraction, bilateral Bilateral    Past Medical History  Diagnosis Date  . Neuromuscular disorder   . Anxiety   . Mental disorder   . Arthritis   . Headache(784.0)   . Cataract     left and right   BP 122/73  Pulse 70  Resp 14  Ht  (1.626 m)  Wt 164 lb 6.4 oz (74.571 kg)  BMI 28.21 kg/m2  SpO2 97%  Opioid Risk Score:   Fall Risk Score: Low Fall Risk (0-5 points)  Review of Systems     Objective:   Physical Exam  Constitutional: She is oriented to person, place, and time.  Neurological: She is alert and oriented to person, place, and time. Gait normal.  Reflex Scores:      Patellar reflexes are 0 on the right side and 2+ on the left side.      Achilles reflexes are 0 on the right side and 0 on the left side. Right hip flexor 4/5 left hip flexor 5/5  bilateral knee extensors bilateral dorsiflexors 5/5  Sensation reduced right medial thigh L2-L3 distribution  Psychiatric: Her affect is labile.          Assessment & Plan:  1. 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 Some improvement after surgery L3-4 fusion months ago. Starting to have popping with twisting motion along with increased right anterior thigh pain. Advise followup with neurosurgery Continues to require narcotic analgesics as well as muscle relaxers and gabapentin.  Continue oxycodone 10 mg 4 times a day  Continue Valium 5 mg 2 times a day as needed with goal of reducing this over the next several months. I advised going down to one half tablet twice a day for a week and then once a day per week after she  settles down from her husbands death. Gabapentin 3 times a day  Nurse practitioner visit one month M.D. visit 3 months We did discuss potential need for epidural injection right L3 but would like to have neurosurgery first evaluate

## 2014-08-23 ENCOUNTER — Encounter: Payer: Medicare Other | Attending: Physical Medicine & Rehabilitation | Admitting: Registered Nurse

## 2014-08-23 ENCOUNTER — Encounter: Payer: Self-pay | Admitting: Registered Nurse

## 2014-08-23 VITALS — BP 133/68 | HR 64 | Resp 14 | Ht 64.5 in | Wt 163.0 lb

## 2014-08-23 DIAGNOSIS — G709 Myoneural disorder, unspecified: Secondary | ICD-10-CM | POA: Insufficient documentation

## 2014-08-23 DIAGNOSIS — M4316 Spondylolisthesis, lumbar region: Secondary | ICD-10-CM

## 2014-08-23 DIAGNOSIS — F419 Anxiety disorder, unspecified: Secondary | ICD-10-CM | POA: Diagnosis not present

## 2014-08-23 DIAGNOSIS — M961 Postlaminectomy syndrome, not elsewhere classified: Secondary | ICD-10-CM

## 2014-08-23 DIAGNOSIS — M1990 Unspecified osteoarthritis, unspecified site: Secondary | ICD-10-CM | POA: Diagnosis not present

## 2014-08-23 DIAGNOSIS — M79651 Pain in right thigh: Secondary | ICD-10-CM | POA: Diagnosis not present

## 2014-08-23 DIAGNOSIS — Z79899 Other long term (current) drug therapy: Secondary | ICD-10-CM

## 2014-08-23 DIAGNOSIS — H269 Unspecified cataract: Secondary | ICD-10-CM | POA: Insufficient documentation

## 2014-08-23 DIAGNOSIS — R51 Headache: Secondary | ICD-10-CM | POA: Insufficient documentation

## 2014-08-23 DIAGNOSIS — Z5181 Encounter for therapeutic drug level monitoring: Secondary | ICD-10-CM

## 2014-08-23 DIAGNOSIS — F4321 Adjustment disorder with depressed mood: Secondary | ICD-10-CM | POA: Diagnosis not present

## 2014-08-23 DIAGNOSIS — Z760 Encounter for issue of repeat prescription: Secondary | ICD-10-CM | POA: Diagnosis present

## 2014-08-23 DIAGNOSIS — F99 Mental disorder, not otherwise specified: Secondary | ICD-10-CM | POA: Insufficient documentation

## 2014-08-23 DIAGNOSIS — M545 Low back pain: Secondary | ICD-10-CM | POA: Diagnosis not present

## 2014-08-23 DIAGNOSIS — G8929 Other chronic pain: Secondary | ICD-10-CM | POA: Insufficient documentation

## 2014-08-23 MED ORDER — OXYCODONE HCL 5 MG PO TABS: 5.0000 mg | ORAL_TABLET | ORAL | Status: DC | PRN

## 2014-08-23 NOTE — Progress Notes (Signed)
Subjective:    Patient ID: Shirley Hamilton, female    DOB: 10-17-1936, 78 y.o.   MRN: [id number]  HPI: Shirley Hamilton is a 78 year old female who returns For follow up for chronic pain and medication refill, She says her pain is located in her lower back and right thigh. She rates her pain 7. Her current exercise reigme is walking.  Tearful today, still grieving husband loss. She says the diazepam is helping, encourage to seek counseling she verbalizes understanding,  Pain Inventory Average Pain 7 Pain Right Now 7 My pain is sharp and stabbing  In the last 24 hours, has pain interfered with the following? General activity 0 Relation with others 0 Enjoyment of life 0 What TIME of day is your pain at its worst? daytime, night Sleep (in general) Poor  Pain is worse with: no recording Pain improves with: no recording Relief from Meds: no recoprding  Mobility walk without assistance how many minutes can you walk? 15 ability to climb steps?  yes do you drive?  yes Do you have any goals in this area?  yes  Function retired  Neuro/Psych No problems in this area  Prior Studies Any changes since last visit?  no  Physicians involved in your care Any changes since last visit?  no   No family history on file. History   Social History  . Marital Status: Married    Spouse Name: N/A    Number of Children: N/A  . Years of Education: N/A   Social History Main Topics  . Smoking status: Never Smoker   . Smokeless tobacco: Never Used  . Alcohol Use: Yes     Comment: socially  . Drug Use: No  . Sexual Activity: None   Other Topics Concern  . None   Social History Narrative   Past Surgical History  Procedure Laterality Date  . Spine surgery    . Knee surgery      right  . Cataract extraction, bilateral Bilateral    Past Medical History  Diagnosis Date  . Neuromuscular disorder   . Anxiety   . Mental disorder   . Arthritis   . Headache(784.0)   . Cataract      left and right   BP 133/68 mmHg  Pulse 64  Resp 14  Ht 5' 4.5" (1.638 m)  Wt 163 lb (73.936 kg)  BMI 27.56 kg/m2  SpO2 93%  Opioid Risk Score:   Fall Risk Score:   Review of Systems     Objective:   Physical Exam  Constitutional: She is oriented to person, place, and time. She appears well-developed and well-nourished.  HENT:  Head: Normocephalic and atraumatic.  Neck: Normal range of motion. Neck supple.  Cardiovascular: Normal rate and regular rhythm.   Pulmonary/Chest: Effort normal and breath sounds normal.  Musculoskeletal:  Normal Muscle Bulk and Muscle testing reveals: Upper Extremities: Full ROM and Muscle strength 5/5 Lumbar Paraspinal Tenderness Lower extremities: Full ROM and Muscle Strength 5/5 Arises from chair with ease  Neurological: She is alert and oriented to person, place, and time.  Skin: Skin is warm and dry.  Psychiatric: She has a normal mood and affect.  Nursing note and vitals reviewed.         Assessment & Plan:  1. 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  Refilled:oxyCODONE 5mg  one tablet every 4 hours as needed #120. Alternate heat and ice therapy. Continue to use Voltaren gel and gabapentin  20 minutes of face to face patient care time was spent during this visit. All questions were encouraged and answered.  F/U in 1 month

## 2014-09-21 ENCOUNTER — Encounter: Payer: Self-pay | Admitting: Registered Nurse

## 2014-09-21 ENCOUNTER — Encounter: Payer: Medicare Other | Attending: Physical Medicine & Rehabilitation | Admitting: Registered Nurse

## 2014-09-21 VITALS — BP 146/83 | HR 65 | Resp 14 | Ht 64.5 in | Wt 168.0 lb

## 2014-09-21 DIAGNOSIS — G8929 Other chronic pain: Secondary | ICD-10-CM | POA: Insufficient documentation

## 2014-09-21 DIAGNOSIS — M79651 Pain in right thigh: Secondary | ICD-10-CM | POA: Insufficient documentation

## 2014-09-21 DIAGNOSIS — F99 Mental disorder, not otherwise specified: Secondary | ICD-10-CM | POA: Diagnosis not present

## 2014-09-21 DIAGNOSIS — M4316 Spondylolisthesis, lumbar region: Secondary | ICD-10-CM

## 2014-09-21 DIAGNOSIS — Z760 Encounter for issue of repeat prescription: Secondary | ICD-10-CM | POA: Diagnosis present

## 2014-09-21 DIAGNOSIS — R51 Headache: Secondary | ICD-10-CM | POA: Diagnosis not present

## 2014-09-21 DIAGNOSIS — F419 Anxiety disorder, unspecified: Secondary | ICD-10-CM | POA: Insufficient documentation

## 2014-09-21 DIAGNOSIS — F4321 Adjustment disorder with depressed mood: Secondary | ICD-10-CM | POA: Insufficient documentation

## 2014-09-21 DIAGNOSIS — M545 Low back pain: Secondary | ICD-10-CM | POA: Insufficient documentation

## 2014-09-21 DIAGNOSIS — M961 Postlaminectomy syndrome, not elsewhere classified: Secondary | ICD-10-CM | POA: Diagnosis not present

## 2014-09-21 DIAGNOSIS — G894 Chronic pain syndrome: Secondary | ICD-10-CM

## 2014-09-21 DIAGNOSIS — Z79899 Other long term (current) drug therapy: Secondary | ICD-10-CM

## 2014-09-21 DIAGNOSIS — G709 Myoneural disorder, unspecified: Secondary | ICD-10-CM | POA: Insufficient documentation

## 2014-09-21 DIAGNOSIS — M1990 Unspecified osteoarthritis, unspecified site: Secondary | ICD-10-CM | POA: Diagnosis not present

## 2014-09-21 DIAGNOSIS — H269 Unspecified cataract: Secondary | ICD-10-CM | POA: Diagnosis not present

## 2014-09-21 DIAGNOSIS — Z5181 Encounter for therapeutic drug level monitoring: Secondary | ICD-10-CM

## 2014-09-21 MED ORDER — OXYCODONE HCL 5 MG PO TABS: 5.0000 mg | ORAL_TABLET | ORAL | Status: DC | PRN

## 2014-09-21 NOTE — Progress Notes (Signed)
Subjective:    Patient ID: Shirley Hamilton, female    DOB: 17-Jun-1936, 78 y.o.   MRN: [id number]  HPI: Shirley Hamilton is a 78 year old female who returns For follow up for chronic pain and medication refill, She says her pain is located in her lower back. She rates her pain 8. Her current exercise reigme is walking.   Pain Inventory Average Pain 8 Pain Right Now 8 My pain is sharp, dull, stabbing and tingling  In the last 24 hours, has pain interfered with the following? General activity 7 Relation with others 6 Enjoyment of life 7 What TIME of day is your pain at its worst? daytime, night Sleep (in general) Poor  Pain is worse with: bending, sitting and standing Pain improves with: rest, pacing activities and medication Relief from Meds: 5  Mobility walk without assistance how many minutes can you walk? 20 ability to climb steps?  yes do you drive?  yes Do you have any goals in this area?  yes  Function retired  Neuro/Psych numbness tingling  Prior Studies Any changes since last visit?  no bone scan x-rays CT/MRI  Physicians involved in your care Any changes since last visit?  no   History reviewed. No pertinent family history. History   Social History  . Marital Status: Married    Spouse Name: N/A    Number of Children: N/A  . Years of Education: N/A   Social History Main Topics  . Smoking status: Never Smoker   . Smokeless tobacco: Never Used  . Alcohol Use: Yes     Comment: socially  . Drug Use: No  . Sexual Activity: None   Other Topics Concern  . None   Social History Narrative   Past Surgical History  Procedure Laterality Date  . Spine surgery    . Knee surgery      right  . Cataract extraction, bilateral Bilateral    Past Medical History  Diagnosis Date  . Neuromuscular disorder   . Anxiety   . Mental disorder   . Arthritis   . Headache(784.0)   . Cataract     left and right   There were no vitals taken for this  visit.  Opioid Risk Score:   Fall Risk Score: Low Fall Risk (0-5 points) (pt declin ed pamphlet) Review of Systems  Neurological: Positive for numbness.       Tingling  All other systems reviewed and are negative.      Objective:   Physical Exam  Constitutional: She is oriented to person, place, and time. She appears well-developed and well-nourished.  HENT:  Head: Normocephalic and atraumatic.  Neck: Normal range of motion. Neck supple.  Cardiovascular: Normal rate and regular rhythm.   Pulmonary/Chest: Effort normal and breath sounds normal.  Musculoskeletal: She exhibits edema.  Normal Muscle Bulk and Muscle testing Reveals: Upper Extremities: Full ROM and Muscle strength 5/5 Lumbar Paraspinal Tenderness: L-3- L-5 Lower Extremities: Full ROM and Muscle strength 5/5 Arises from chair with ease Narrow Based gait   Neurological: She is alert and oriented to person, place, and time.  Skin: Skin is warm and dry.  Psychiatric: She has a normal mood and affect.  Nursing note and vitals reviewed.         Assessment & Plan:  1. 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  Refilled:oxyCODONE 5mg  one tablet every 4 hours as needed #120. Alternate heat and ice therapy. Continue to use Voltaren  gel and gabapentin  20 minutes of face to face patient care time was spent during this visit. All questions were encouraged and answered.

## 2014-09-22 ENCOUNTER — Other Ambulatory Visit: Payer: Self-pay | Admitting: Physical Medicine & Rehabilitation

## 2014-09-23 LAB — PMP ALCOHOL METABOLITE (ETG): ETGU: NEGATIVE ng/mL

## 2014-09-29 LAB — OXYCODONE, URINE (LC/MS-MS)
Noroxycodone, Ur: 5812 ng/mL (ref <50)
OXYCODONE, UR: 5239 ng/mL (ref <50)
Oxymorphone: 900 ng/mL (ref <50)

## 2014-09-29 LAB — ZOLPIDEM (LC/MS-MS), URINE
ZOLPIDEM (GC/LC/MS), UR CONFIRM: 5 ng/mL — AB (ref <5)
Zolpidem metabolite (GC/LC/MS) Ur, confirm: 326 ng/mL — AB (ref <5)

## 2014-09-29 LAB — BENZODIAZEPINES (GC/LC/MS), URINE
Alprazolam metabolite (GC/LC/MS), ur confirm: NEGATIVE ng/mL (ref <25)
Clonazepam metabolite (GC/LC/MS), ur confirm: NEGATIVE ng/mL (ref <25)
Flurazepam metabolite (GC/LC/MS), ur confirm: NEGATIVE ng/mL (ref <50)
LORAZEPAMU: NEGATIVE ng/mL (ref <50)
Midazolam (GC/LC/MS), ur confirm: NEGATIVE ng/mL (ref <50)
NORDIAZEPAMU: 627 ng/mL (ref <50)
Oxazepam (GC/LC/MS), ur confirm: 1431 ng/mL (ref <50)
Temazepam (GC/LC/MS), ur confirm: 3199 ng/mL (ref <50)
Triazolam metabolite (GC/LC/MS), ur confirm: NEGATIVE ng/mL (ref <50)

## 2014-09-29 LAB — OPIATES/OPIOIDS (LC/MS-MS)
CODEINE URINE: NEGATIVE ng/mL (ref <50)
HYDROCODONE: NEGATIVE ng/mL (ref <50)
Hydromorphone: NEGATIVE ng/mL (ref <50)
MORPHINE: NEGATIVE ng/mL (ref <50)
NORHYDROCODONE, UR: NEGATIVE ng/mL (ref <50)
Noroxycodone, Ur: 5812 ng/mL (ref <50)
OXYMORPHONE, URINE: 900 ng/mL (ref <50)
Oxycodone, ur: 5239 ng/mL (ref <50)

## 2014-09-29 LAB — AMPHETAMINES (GC/LC/MS), URINE
Amphetamine GC/MS Conf: NEGATIVE ng/mL (ref <250)
Methamphetamine Quant, Ur: NEGATIVE ng/mL (ref <250)

## 2014-09-30 LAB — PRESCRIPTION MONITORING PROFILE (SOLSTAS)
Barbiturate Screen, Urine: NEGATIVE ng/mL
Buprenorphine, Urine: NEGATIVE ng/mL
CARISOPRODOL, URINE: NEGATIVE ng/mL
COCAINE METABOLITES: NEGATIVE ng/mL
Cannabinoid Scrn, Ur: NEGATIVE ng/mL
Creatinine, Urine: 172.46 mg/dL (ref >20.0)
FENTANYL URINE: NEGATIVE ng/mL
MDMA URINE: NEGATIVE ng/mL
METHADONE SCREEN, URINE: NEGATIVE ng/mL
Meperidine, Ur: NEGATIVE ng/mL
Nitrites, Initial: NEGATIVE ug/mL
PH URINE, INITIAL: 5.3 pH (ref 4.5–8.9)
Propoxyphene: NEGATIVE ng/mL
TRAMADOL UR: NEGATIVE ng/mL
Tapentadol, urine: NEGATIVE ng/mL

## 2014-10-04 NOTE — Progress Notes (Signed)
Urine drug screen for this encounter is consistent 

## 2014-10-19 ENCOUNTER — Encounter: Payer: Self-pay | Admitting: Registered Nurse

## 2014-10-19 ENCOUNTER — Encounter (HOSPITAL_BASED_OUTPATIENT_CLINIC_OR_DEPARTMENT_OTHER): Payer: Medicare Other | Admitting: Registered Nurse

## 2014-10-19 VITALS — BP 98/55 | HR 70 | Resp 14

## 2014-10-19 DIAGNOSIS — Z760 Encounter for issue of repeat prescription: Secondary | ICD-10-CM | POA: Diagnosis not present

## 2014-10-19 DIAGNOSIS — G894 Chronic pain syndrome: Secondary | ICD-10-CM

## 2014-10-19 DIAGNOSIS — M4316 Spondylolisthesis, lumbar region: Secondary | ICD-10-CM

## 2014-10-19 DIAGNOSIS — Z79899 Other long term (current) drug therapy: Secondary | ICD-10-CM

## 2014-10-19 DIAGNOSIS — Z5181 Encounter for therapeutic drug level monitoring: Secondary | ICD-10-CM

## 2014-10-19 DIAGNOSIS — M961 Postlaminectomy syndrome, not elsewhere classified: Secondary | ICD-10-CM

## 2014-10-19 MED ORDER — DIAZEPAM 5 MG PO TABS: 5.0000 mg | ORAL_TABLET | Freq: Two times a day (BID) | ORAL | Status: DC

## 2014-10-19 MED ORDER — OXYCODONE HCL 5 MG PO TABS: 5.0000 mg | ORAL_TABLET | ORAL | Status: DC | PRN

## 2014-10-19 NOTE — Progress Notes (Signed)
Subjective:    Patient ID: Shirley Hamilton, female    DOB: [birth year], 78 y.o.   MRN: [id number]  HPI: Shirley Hamilton is a 78 year old female who returns For follow up for chronic pain and medication refill, She says her pain is located in her lower back and right thigh. She rates her pain 7. Her current exercise reigme is walking.  Arrived hypotensive blood pressure re-checked 107/56.  Pain Inventory Average Pain 8 Pain Right Now 7 My pain is sharp, dull, stabbing, tingling and aching  In the last 24 hours, has pain interfered with the following? General activity 6 Relation with others 6 Enjoyment of life 5 What TIME of day is your pain at its worst? morning,evening, night Sleep (in general) Poor  Pain is worse with: sitting and standing Pain improves with: rest and medication Relief from Meds: 5  Mobility walk with assistance how many minutes can you walk? 15-30 ability to climb steps?  yes do you drive?  yes Do you have any goals in this area?  yes  Function retired  Neuro/Psych bladder control problems  Prior Studies Any changes since last visit?  yes  Physicians involved in your care Any changes since last visit?  no   No family history on file. History   Social History  . Marital Status: Married    Spouse Name: N/A    Number of Children: N/A  . Years of Education: N/A   Social History Main Topics  . Smoking status: Never Smoker   . Smokeless tobacco: Never Used  . Alcohol Use: Yes     Comment: socially  . Drug Use: No  . Sexual Activity: None   Other Topics Concern  . None   Social History Narrative   Past Surgical History  Procedure Laterality Date  . Spine surgery    . Knee surgery      right  . Cataract extraction, bilateral Bilateral    Past Medical History  Diagnosis Date  . Neuromuscular disorder   . Anxiety   . Mental disorder   . Arthritis   . Headache(784.0)   . Cataract     left and right   BP 98/55 mmHg  Pulse  70  Resp 14  SpO2 93%  Opioid Risk Score:   Fall Risk Score: Moderate Fall Risk (6-13 points) Review of Systems  All other systems reviewed and are negative.      Objective:   Physical Exam  Constitutional: She is oriented to person, place, and time. She appears well-developed and well-nourished.  HENT:  Head: Normocephalic and atraumatic.  Neck: Normal range of motion. Neck supple.  Cardiovascular: Normal rate and regular rhythm.   Pulmonary/Chest: Effort normal. She has wheezes.  Musculoskeletal:  Normal Muscle Bulk and Muscle Testing Reveals: Upper Extremities: Full ROM and Muscle Strength 5/5 Back without spinal or Paraspinal Tenderness Lower extremities: Full ROM and Muscle strength 5/5 Arises from chair with ease Narrow based gait  Neurological: She is alert and oriented to person, place, and time.  Skin: Skin is warm and dry.  Psychiatric: She has a normal mood and affect.  Nursing note and vitals reviewed.         Assessment & Plan:  1. 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  Refilled:oxyCODONE 5mg  one tablet every 4 hours as needed #120. Alternate heat and ice therapy. Continue to use Voltaren gel and gabapentin   20 minutes of face to face patient care  time was spent during this visit. All questions were encouraged and answered.

## 2014-10-23 ENCOUNTER — Encounter: Payer: Self-pay | Admitting: Podiatry

## 2014-10-23 ENCOUNTER — Ambulatory Visit (INDEPENDENT_AMBULATORY_CARE_PROVIDER_SITE_OTHER): Payer: Medicare Other | Admitting: Podiatry

## 2014-10-23 ENCOUNTER — Other Ambulatory Visit: Payer: Self-pay | Admitting: Podiatry

## 2014-10-23 ENCOUNTER — Ambulatory Visit (INDEPENDENT_AMBULATORY_CARE_PROVIDER_SITE_OTHER): Payer: Medicare Other

## 2014-10-23 VITALS — BP 118/65 | HR 62 | Resp 16

## 2014-10-23 DIAGNOSIS — M2042 Other hammer toe(s) (acquired), left foot: Secondary | ICD-10-CM

## 2014-10-23 DIAGNOSIS — M2041 Other hammer toe(s) (acquired), right foot: Secondary | ICD-10-CM

## 2014-10-23 NOTE — Progress Notes (Signed)
   Subjective:    Patient ID: Shirley Hamilton, female    DOB: 1936/01/27, 79 y.o.   MRN: [id number]  HPI Comments: "I have a hammer toe"  Patient c/o aching toes bilateral for several years. The toes are curled. She has had previous surgery on the toes to straighten. The tip of the 3rd toe right is callused and tender. Hurts to walk a lot. Tries to keep area filed.  Toe Pain       Review of Systems  Constitutional: Positive for unexpected weight change.  Musculoskeletal: Positive for back pain.  Skin:       Change in nails  All other systems reviewed and are negative.      Objective:   Physical Exam        Assessment & Plan:

## 2014-10-25 NOTE — Progress Notes (Signed)
Subjective:     Patient ID: Shirley Hamilton, female   DOB: 05/07/1936, 79 y.o.   MRN: [id number]  HPI patient presents stating she has a lot of problems with her toes and it seems specifically the third and fourth toes are worse and she's had previous surgery on the second which did help that but did not resolve the condition she is experiencing with pain   Review of Systems  All other systems reviewed and are negative.      Objective:   Physical Exam  Constitutional: She is oriented to person, place, and time.  Cardiovascular: Intact distal pulses.   Musculoskeletal: Normal range of motion.  Neurological: She is oriented to person, place, and time.  Skin: Skin is warm.  Nursing note and vitals reviewed.  neurovascular status found to be intact with muscle strength adequate and range of motion subtalar midtarsal joint within normal limits. Patient's head digital surgery second right and structural bunion correction and is noted to have keratotic lesion distal third right with curling of the third and fourth toes and pain on the proximal phalanx joint with shoe gear usage. She's tried trimming padding and wider shoes without relief of her symptoms and is also noted to have good digital perfusion and is well oriented 3     Assessment:     Hammertoe deformity third and fourth right secondary to structural abnormality of the toes creating pressure    Plan:     H&P and x-rays reviewed with patient. We discussed debridement and padding versus digital fusion and straightening and she has opted for surgery. At this time I reviewed with her what would be required and allowed her to read a consent form for correction explaining all complications and alternative treatments as listed. She is willing to accept risk and wants surgery and signs consent form and is scheduled for outpatient surgery in the next several weeks and is encouraged to call with any questions prior to surgery. She is dispense  preoperative kit explaining what to do and will call us if she should have any questions prior to procedure. She is scheduled for digital fusion of digits 3 and 4 on the right which both have abnormalities

## 2014-11-02 ENCOUNTER — Ambulatory Visit: Payer: Medicare Other | Admitting: Physical Medicine & Rehabilitation

## 2014-11-07 ENCOUNTER — Telehealth: Payer: Self-pay

## 2014-11-07 NOTE — Telephone Encounter (Signed)
Shirley Hamilton [phone]  Robynn PaneElise called and wanted to let us know per her contract that she was having surgery on 11/14/2014 bu Dr Charlsie Merlesegal to have her Right toes realigned, and she was told she could continue taking her medicines as prescribed right up until the morning of her surgery.

## 2014-11-13 NOTE — Telephone Encounter (Signed)
I reviewed the note. I agree. Postoperatively Dr. Charlsie Merlesegal will need to prescribe the pain medication for at least one month. I would anticipate that she will need a higher dose than her usual regimen After she is released by her surgeon, we can resume her usual medications

## 2014-11-14 ENCOUNTER — Encounter: Payer: Self-pay | Admitting: Podiatry

## 2014-11-14 DIAGNOSIS — M2041 Other hammer toe(s) (acquired), right foot: Secondary | ICD-10-CM

## 2014-11-16 NOTE — Progress Notes (Signed)
DOS 11/14/2014 Right hammer toe 3, 4th hammer toe repair with pins performed by Dr. Charlsie Merlesegal.

## 2014-11-20 ENCOUNTER — Encounter: Payer: Self-pay | Admitting: Physical Medicine & Rehabilitation

## 2014-11-20 ENCOUNTER — Ambulatory Visit (INDEPENDENT_AMBULATORY_CARE_PROVIDER_SITE_OTHER): Payer: Medicare Other | Admitting: Podiatry

## 2014-11-20 ENCOUNTER — Ambulatory Visit (INDEPENDENT_AMBULATORY_CARE_PROVIDER_SITE_OTHER): Payer: Medicare Other

## 2014-11-20 ENCOUNTER — Encounter: Payer: Medicare Other | Attending: Physical Medicine & Rehabilitation

## 2014-11-20 ENCOUNTER — Ambulatory Visit (HOSPITAL_BASED_OUTPATIENT_CLINIC_OR_DEPARTMENT_OTHER): Payer: Medicare Other | Admitting: Physical Medicine & Rehabilitation

## 2014-11-20 ENCOUNTER — Encounter: Payer: Self-pay | Admitting: Podiatry

## 2014-11-20 VITALS — BP 119/70 | HR 66 | Resp 16

## 2014-11-20 VITALS — BP 110/54 | HR 72 | Resp 14

## 2014-11-20 DIAGNOSIS — M1990 Unspecified osteoarthritis, unspecified site: Secondary | ICD-10-CM | POA: Diagnosis not present

## 2014-11-20 DIAGNOSIS — M961 Postlaminectomy syndrome, not elsewhere classified: Secondary | ICD-10-CM

## 2014-11-20 DIAGNOSIS — Z760 Encounter for issue of repeat prescription: Secondary | ICD-10-CM | POA: Diagnosis not present

## 2014-11-20 DIAGNOSIS — F99 Mental disorder, not otherwise specified: Secondary | ICD-10-CM | POA: Insufficient documentation

## 2014-11-20 DIAGNOSIS — M545 Low back pain: Secondary | ICD-10-CM | POA: Diagnosis not present

## 2014-11-20 DIAGNOSIS — R51 Headache: Secondary | ICD-10-CM | POA: Diagnosis not present

## 2014-11-20 DIAGNOSIS — G709 Myoneural disorder, unspecified: Secondary | ICD-10-CM | POA: Diagnosis not present

## 2014-11-20 DIAGNOSIS — M2041 Other hammer toe(s) (acquired), right foot: Secondary | ICD-10-CM

## 2014-11-20 DIAGNOSIS — F419 Anxiety disorder, unspecified: Secondary | ICD-10-CM | POA: Insufficient documentation

## 2014-11-20 DIAGNOSIS — G8929 Other chronic pain: Secondary | ICD-10-CM | POA: Diagnosis not present

## 2014-11-20 DIAGNOSIS — M4316 Spondylolisthesis, lumbar region: Secondary | ICD-10-CM

## 2014-11-20 DIAGNOSIS — M79651 Pain in right thigh: Secondary | ICD-10-CM | POA: Diagnosis not present

## 2014-11-20 DIAGNOSIS — F4321 Adjustment disorder with depressed mood: Secondary | ICD-10-CM | POA: Insufficient documentation

## 2014-11-20 DIAGNOSIS — H269 Unspecified cataract: Secondary | ICD-10-CM | POA: Diagnosis not present

## 2014-11-20 MED ORDER — OXYCODONE HCL 5 MG PO TABS: 5.0000 mg | ORAL_TABLET | ORAL | Status: DC | PRN

## 2014-11-20 MED ORDER — GABAPENTIN 300 MG PO CAPS: 300.0000 mg | ORAL_CAPSULE | Freq: Three times a day (TID) | ORAL | Status: DC

## 2014-11-20 NOTE — Patient Instructions (Addendum)
Shirley Hamilton next month  Recommend discontinuing hydrocodone as soon as possible

## 2014-11-20 NOTE — Progress Notes (Signed)
Subjective:    Patient ID: Shirley Hamilton, female    DOB: 1936-09-10, 79 y.o.   MRN: [id number]  HPI Underwent Right 3rd and 4th toe pinning of hammertoes, Patient had this done by Dr. Charlsie Merlesegal. Received hydrocodone postoperatively in addition to her usual oxycodone.  Some exacerbation of right anterior thigh pain postoperatively.  No falls no new traumas.  Pain Inventory Average Pain 8 Pain Right Now 9 My pain is sharp, stabbing and tingling  In the last 24 hours, has pain interfered with the following? General activity 5 Relation with others 6 Enjoyment of life 7 What TIME of day is your pain at its worst? daytime, night Sleep (in general) Poor  Pain is worse with: bending, sitting and standing Pain improves with: rest, pacing activities and medication Relief from Meds: 7  Mobility walk without assistance how many minutes can you walk? 15-30 ability to climb steps?  yes do you drive?  yes Do you have any goals in this area?  yes  Function retired  Neuro/Psych bladder control problems numbness tingling spasms depression anxiety  Prior Studies Any changes since last visit?  yes foot surgery  Physicians involved in your care Any changes since last visit?  no   History reviewed. No pertinent family history. History   Social History  . Marital Status: Married    Spouse Name: N/A    Number of Children: N/A  . Years of Education: N/A   Social History Main Topics  . Smoking status: Never Smoker   . Smokeless tobacco: Never Used  . Alcohol Use: Yes     Comment: socially  . Drug Use: No  . Sexual Activity: None   Other Topics Concern  . None   Social History Narrative   Past Surgical History  Procedure Laterality Date  . Spine surgery    . Knee surgery      right  . Cataract extraction, bilateral Bilateral    Past Medical History  Diagnosis Date  . Neuromuscular disorder   . Anxiety   . Mental disorder   . Arthritis   . Headache(784.0)     . Cataract     left and right   BP 110/54 mmHg  Pulse 72  Resp 14  SpO2 95%  Opioid Risk Score:   Fall Risk Score: Moderate Fall Risk (6-13 points) (pt declined fall safety prevention packet during todays visit)  Review of Systems  Cardiovascular: Positive for leg swelling.  Neurological: Positive for numbness.       Tingling Spasms   Psychiatric/Behavioral: Positive for dysphoric mood. The patient is nervous/anxious.   All other systems reviewed and are negative.      Objective:   Physical Exam  Constitutional: She appears well-developed and well-nourished.  HENT:  Head: Normocephalic and atraumatic.  Musculoskeletal:  Right greater than left pretibial edema No Calf tenderness.  Psychiatric: She has a normal mood and affect.  Nursing note and vitals reviewed.         Assessment & Plan:  1.  Lumbar post lami syndrome with chronic L3 Radic, Some exacerbation which is likely related to alteration in gait, I would expect this to improve as she recovers from her foot surgery Continue oxycodone5 mg 4 times a day Continue gabapentin 300 mg 4 times a day Continue Valium 5 mg twice a day   2.  Post op pain R foot.   Podiatry is writing for hydrocodone 10 mg 4 times a day when necessary, patient has  more delayed responses today advised discontinuing this as soon as feasible once postop pain subsides

## 2014-11-20 NOTE — Progress Notes (Signed)
Subjective:     Patient ID: Shirley HeathElsie A Hamilton, female   DOB: [birth year], 79 y.o.   MRN: [id number]  HPI patient states that I'm doing well and my toes are not hurting me or I don't feel much swelling   Review of Systems     Objective:   Physical Exam Neurovascular status intact negative Homans sign noted with well positioned third and fourth toes right foot with pins in place and toes straight with wound edges well coapted    Assessment:     Doing well post digital fusion digits 34 of the right foot    Plan:     X-rays reviewed and sterile dressing reapplied. Reappoint 2 weeks for suture removal or earlier if needed

## 2014-11-29 ENCOUNTER — Telehealth: Payer: Self-pay | Admitting: *Deleted

## 2014-11-29 MED ORDER — HYDROCODONE-ACETAMINOPHEN 10-325 MG PO TABS: 1.0000 | ORAL_TABLET | Freq: Four times a day (QID) | ORAL | Status: DC | PRN

## 2014-11-29 NOTE — Telephone Encounter (Signed)
Pt asked if she could get her surgical foot wet and take off the boot.  I instructed the pt to remain in the dressing and boot until after the suture removal on Monday, and ask Dr. Charlsie Merlesegal about showering at that time.  Pt request refill of the Vicodin.  Dr. Charlsie Merlesegal states refill as previously.  I informed pt she would [facility]

## 2014-12-05 ENCOUNTER — Ambulatory Visit (INDEPENDENT_AMBULATORY_CARE_PROVIDER_SITE_OTHER): Payer: Medicare Other

## 2014-12-05 DIAGNOSIS — M2042 Other hammer toe(s) (acquired), left foot: Secondary | ICD-10-CM

## 2014-12-05 NOTE — Progress Notes (Signed)
Pt presents for suture removal. Sutures removed, wound edges aligned and approximated. No redness, swelling or drainage noted. Pt admits to managing pain effectively. Noted external pin in 3rd met to be extended outwardly but still remained in toe. Pt denied pain. Reinforced with Koflex, , wrapped in compression dressing and advised to remain in surgical shoe, and follow up in 2 weeks for pin removal. Advised on s/s of infection and to seek medical help if any of those symptoms occur

## 2014-12-06 ENCOUNTER — Encounter: Payer: Self-pay | Admitting: Podiatry

## 2014-12-11 ENCOUNTER — Telehealth: Payer: Self-pay | Admitting: Podiatry

## 2014-12-11 NOTE — Telephone Encounter (Signed)
Patient called stating she left a message last week on the nurse voicemail and never got a call back. Shirley Hamilton is wanting to know if she is able to drive yet. She also has another question in regards to going to [place] and if she might need a prescription for a motorized wheel chair to get around. Please call. Thank you.

## 2014-12-12 ENCOUNTER — Telehealth: Payer: Self-pay

## 2014-12-12 NOTE — Telephone Encounter (Signed)
Left message informing pt of Dr.Regal's orders. 

## 2014-12-12 NOTE — Telephone Encounter (Signed)
Spoke with pt regarding driving and status of pin removal, we also discuss her care when going out of town. I advised her that her pins will be removed her next visit, and she is to not drive until pins are removed. We would discuss options of mobility at her next appt with the doctor

## 2014-12-12 NOTE — Telephone Encounter (Signed)
She can drive. If she goes to [place] she should not be doing a lot of walking

## 2014-12-18 ENCOUNTER — Ambulatory Visit (INDEPENDENT_AMBULATORY_CARE_PROVIDER_SITE_OTHER): Payer: Medicare Other | Admitting: Podiatry

## 2014-12-18 ENCOUNTER — Ambulatory Visit (INDEPENDENT_AMBULATORY_CARE_PROVIDER_SITE_OTHER): Payer: Medicare Other

## 2014-12-18 DIAGNOSIS — M2041 Other hammer toe(s) (acquired), right foot: Secondary | ICD-10-CM

## 2014-12-18 DIAGNOSIS — Z9889 Other specified postprocedural states: Secondary | ICD-10-CM

## 2014-12-19 ENCOUNTER — Encounter: Payer: Self-pay | Admitting: Registered Nurse

## 2014-12-19 ENCOUNTER — Encounter: Payer: Medicare Other | Attending: Physical Medicine & Rehabilitation | Admitting: Registered Nurse

## 2014-12-19 VITALS — BP 134/78 | HR 78 | Resp 14

## 2014-12-19 DIAGNOSIS — G709 Myoneural disorder, unspecified: Secondary | ICD-10-CM | POA: Insufficient documentation

## 2014-12-19 DIAGNOSIS — F4321 Adjustment disorder with depressed mood: Secondary | ICD-10-CM | POA: Diagnosis not present

## 2014-12-19 DIAGNOSIS — R51 Headache: Secondary | ICD-10-CM | POA: Insufficient documentation

## 2014-12-19 DIAGNOSIS — M545 Low back pain: Secondary | ICD-10-CM | POA: Diagnosis not present

## 2014-12-19 DIAGNOSIS — M79651 Pain in right thigh: Secondary | ICD-10-CM | POA: Diagnosis not present

## 2014-12-19 DIAGNOSIS — Z760 Encounter for issue of repeat prescription: Secondary | ICD-10-CM | POA: Insufficient documentation

## 2014-12-19 DIAGNOSIS — H269 Unspecified cataract: Secondary | ICD-10-CM | POA: Insufficient documentation

## 2014-12-19 DIAGNOSIS — M4316 Spondylolisthesis, lumbar region: Secondary | ICD-10-CM

## 2014-12-19 DIAGNOSIS — G8929 Other chronic pain: Secondary | ICD-10-CM | POA: Diagnosis not present

## 2014-12-19 DIAGNOSIS — F419 Anxiety disorder, unspecified: Secondary | ICD-10-CM | POA: Diagnosis not present

## 2014-12-19 DIAGNOSIS — M1990 Unspecified osteoarthritis, unspecified site: Secondary | ICD-10-CM | POA: Insufficient documentation

## 2014-12-19 DIAGNOSIS — F99 Mental disorder, not otherwise specified: Secondary | ICD-10-CM | POA: Diagnosis not present

## 2014-12-19 DIAGNOSIS — G894 Chronic pain syndrome: Secondary | ICD-10-CM

## 2014-12-19 DIAGNOSIS — Z79899 Other long term (current) drug therapy: Secondary | ICD-10-CM

## 2014-12-19 DIAGNOSIS — Z5181 Encounter for therapeutic drug level monitoring: Secondary | ICD-10-CM

## 2014-12-19 DIAGNOSIS — M961 Postlaminectomy syndrome, not elsewhere classified: Secondary | ICD-10-CM | POA: Diagnosis not present

## 2014-12-19 MED ORDER — OXYCODONE HCL 5 MG PO TABS: 5.0000 mg | ORAL_TABLET | ORAL | Status: DC | PRN

## 2014-12-19 NOTE — Progress Notes (Signed)
Subjective:    Patient ID: Shirley Hamilton, female    DOB: [birth year], 79 y.o.   MRN: [id number]  HPI: Mrs. Shirley Heathlsie A Hamilton is a 79 year old female who returns for follow up for chronic pain and medication refill, She says her pain is located in her lower back, right leg and foot. She rates her pain 8. Her current exercise reigme is walking.  On 11/14/14 she had right hammer toe 3rd and 4 th hammer toe repair with pins performed by Dr. Charlsie Merlesegal. She was prescribed hydrocodone 10/[facility] She was prescribed 30 has 18 at this time. Instructed not to take oxycodone at the same time and not to ask for refills she verbalizes understanding.  Pain Inventory Average Pain 7 Pain Right Now 8 My pain is constant, dull, stabbing and aching  In the last 24 hours, has pain interfered with the following? General activity 6 Relation with others 7 Enjoyment of life 7 What TIME of day is your pain at its worst? ALL Sleep (in general) Fair  Pain is worse with: inactivity Pain improves with: rest and medication Relief from Meds: 6  Mobility walk without assistance how many minutes can you walk? 15 ability to climb steps?  yes do you drive?  yes  Function retired  Neuro/Psych trouble walking spasms  Prior Studies Any changes since last visit?  no  Physicians involved in your care Any changes since last visit?  no   History reviewed. No pertinent family history. History   Social History  . Marital Status: Married    Spouse Name: N/A  . Number of Children: N/A  . Years of Education: N/A   Social History Main Topics  . Smoking status: Never Smoker   . Smokeless tobacco: Never Used  . Alcohol Use: Yes     Comment: socially  . Drug Use: No  . Sexual Activity: Not on file   Other Topics Concern  . None   Social History Narrative   Past Surgical History  Procedure Laterality Date  . Spine surgery    . Knee surgery      right  . Cataract extraction, bilateral  Bilateral    Past Medical History  Diagnosis Date  . Neuromuscular disorder   . Anxiety   . Mental disorder   . Arthritis   . Headache(784.0)   . Cataract     left and right   BP 134/78 mmHg  Pulse 78  Resp 14  SpO2 96%  Opioid Risk Score:   Fall Risk Score: Moderate Fall Risk (6-13 points)  Review of Systems  Constitutional: Negative.   HENT: Negative.   Eyes: Negative.   Respiratory: Negative.   Cardiovascular: Positive for leg swelling.  Gastrointestinal: Negative.   Endocrine: Negative.   Genitourinary: Negative.   Musculoskeletal: Positive for myalgias, back pain and arthralgias.  Allergic/Immunologic: Negative.   Neurological:       Trouble walking, spasms  Hematological: Negative.   Psychiatric/Behavioral: Positive for dysphoric mood. The patient is nervous/anxious.        Objective:   Physical Exam  Constitutional: She is oriented to person, place, and time. She appears well-developed and well-nourished.  HENT:  Head: Normocephalic and atraumatic.  Neck: Normal range of motion. Neck supple.  Cardiovascular: Normal rate and regular rhythm.   Pulmonary/Chest: Effort normal and breath sounds normal.  Musculoskeletal:  Normal Muscle Bulk and Muscle Testing Reveals: Upper Extremities: Full ROM and Muscle Strength 5/5 Lumbar Paraspinal  Tenderness: L-3- L-5 Lower Extremities: Full ROM and Muscle strength 5/5 Right foot with open orthopedic shoe  Arises from chair with ease Narrow Based Gait   Neurological: She is alert and oriented to person, place, and time.  Skin: Skin is warm and dry.  Psychiatric: She has a normal mood and affect.  Nursing note and vitals reviewed.         Assessment & Plan:  1. 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  Refilled:oxyCODONE  one tablet every 4 hours as needed #120. Alternate heat and ice therapy. Continue to use Voltaren gel and gabapentin   20 minutes of face to face patient care time was  spent during this visit. All questions were encouraged and answered.  F/U in 1 month

## 2014-12-19 NOTE — Progress Notes (Signed)
Subjective:     Patient ID: Shirley HeathElsie A Hannig, female   DOB: 1936/04/29, 79 y.o.   MRN: [id number]  HPI patient states I'm doing real well with my toes and I'm ready to get these pins out   Review of Systems     Objective:   Physical Exam Neurovascular status intact with pins in place third fourth toes with digits and good alignment and pins intact and in good alignment    Assessment:     Doing well post digital procedures right foot    Plan:     Pins removed sterile dressings applied and digital splint dispensed. Instructed on continued elevation and reappoint to recheck again in 4 weeks and reviewed x-ray

## 2015-01-19 ENCOUNTER — Encounter: Payer: Medicare Other | Attending: Physical Medicine & Rehabilitation | Admitting: Registered Nurse

## 2015-01-19 ENCOUNTER — Encounter: Payer: Self-pay | Admitting: Registered Nurse

## 2015-01-19 VITALS — BP 129/60 | HR 67 | Resp 14

## 2015-01-19 DIAGNOSIS — M1990 Unspecified osteoarthritis, unspecified site: Secondary | ICD-10-CM | POA: Insufficient documentation

## 2015-01-19 DIAGNOSIS — M79651 Pain in right thigh: Secondary | ICD-10-CM | POA: Diagnosis not present

## 2015-01-19 DIAGNOSIS — Z79899 Other long term (current) drug therapy: Secondary | ICD-10-CM

## 2015-01-19 DIAGNOSIS — F4321 Adjustment disorder with depressed mood: Secondary | ICD-10-CM | POA: Insufficient documentation

## 2015-01-19 DIAGNOSIS — M961 Postlaminectomy syndrome, not elsewhere classified: Secondary | ICD-10-CM | POA: Insufficient documentation

## 2015-01-19 DIAGNOSIS — Z5181 Encounter for therapeutic drug level monitoring: Secondary | ICD-10-CM

## 2015-01-19 DIAGNOSIS — M4316 Spondylolisthesis, lumbar region: Secondary | ICD-10-CM

## 2015-01-19 DIAGNOSIS — G709 Myoneural disorder, unspecified: Secondary | ICD-10-CM | POA: Diagnosis not present

## 2015-01-19 DIAGNOSIS — T402X5A Adverse effect of other opioids, initial encounter: Secondary | ICD-10-CM

## 2015-01-19 DIAGNOSIS — H269 Unspecified cataract: Secondary | ICD-10-CM | POA: Diagnosis not present

## 2015-01-19 DIAGNOSIS — R51 Headache: Secondary | ICD-10-CM | POA: Insufficient documentation

## 2015-01-19 DIAGNOSIS — Z760 Encounter for issue of repeat prescription: Secondary | ICD-10-CM | POA: Insufficient documentation

## 2015-01-19 DIAGNOSIS — K5903 Drug induced constipation: Secondary | ICD-10-CM

## 2015-01-19 DIAGNOSIS — F419 Anxiety disorder, unspecified: Secondary | ICD-10-CM | POA: Insufficient documentation

## 2015-01-19 DIAGNOSIS — M545 Low back pain: Secondary | ICD-10-CM | POA: Insufficient documentation

## 2015-01-19 DIAGNOSIS — G894 Chronic pain syndrome: Secondary | ICD-10-CM

## 2015-01-19 DIAGNOSIS — G8929 Other chronic pain: Secondary | ICD-10-CM | POA: Insufficient documentation

## 2015-01-19 DIAGNOSIS — F99 Mental disorder, not otherwise specified: Secondary | ICD-10-CM | POA: Diagnosis not present

## 2015-01-19 DIAGNOSIS — K5909 Other constipation: Secondary | ICD-10-CM

## 2015-01-19 MED ORDER — OXYCODONE HCL 5 MG PO TABS: 5.0000 mg | ORAL_TABLET | ORAL | Status: DC | PRN

## 2015-01-19 MED ORDER — NALOXEGOL OXALATE 12.5 MG PO TABS: 12.5000 mg | ORAL_TABLET | Freq: Every day | ORAL | Status: AC

## 2015-01-19 NOTE — Progress Notes (Addendum)
Subjective:    Patient ID: Shirley HeathElsie A Hamilton, female    DOB: August 21, 1936, 79 y.o.   MRN: [id number]  HPI: [name] is a 79 year old female who returns for follow up for chronic pain and medication refill.She says her pain is located in her lower back and right thigh . She rates her pain 8. She hasn't followed her current exercise regime latley. She will be starting exercises at [facility]  chair exercises, balancing, stretching exercises and low impact Zumba.  Her PHQ-9 score 16, her husband passed a few months ago and she knows this is reflective in her high score. She will [facility]. She denies suicidal ideation or plan.   She went [place] children [place]ands [place] [facility] [place] [facility] and enjoyed herself.   Pain Inventory Average Pain 7 Pain Right Now 8 My pain is sharp, stabbing, tingling and aching  In the last 24 hours, has pain interfered with the following? General activity 7 Relation with others 4 Enjoyment of life 8 What TIME of day is your pain at its worst? all Sleep (in general) Poor  Pain is worse with: sitting and standing Pain improves with: rest, pacing activities and medication Relief from Meds: 6  Mobility walk without assistance how many minutes can you walk? 15-30 ability to climb steps?  yes do you drive?  yes transfers alone Do you have any goals in this area?  yes  Function retired  Neuro/Psych numbness  Prior Studies Any changes since last visit?  no  Physicians involved in your care Any changes since last visit?  no   History reviewed. No pertinent family history. History   Social History  . Marital Status: Married    Spouse Name: N/A  . Number of Children: N/A  . Years of Education: N/A   Social History Main Topics  . Smoking status: Never Smoker   . Smokeless tobacco: Never Used  . Alcohol Use: Yes     Comment: socially  . Drug Use: No  . Sexual Activity: Not on file   Other Topics  Concern  . None   Social History Narrative   Past Surgical History  Procedure Laterality Date  . Spine surgery    . Knee surgery      right  . Cataract extraction, bilateral Bilateral    Past Medical History  Diagnosis Date  . Neuromuscular disorder   . Anxiety   . Mental disorder   . Arthritis   . Headache(784.0)   . Cataract     left and right   BP 129/60 mmHg  Pulse 67  Resp 14  SpO2 96%  Opioid Risk Score:   Fall Risk Score: Moderate Fall Risk (6-13 points)`1  Depression screen PHQ 2/9  Depression screen PHQ 2/9 01/19/2015  Decreased Interest 2  Down, Depressed, Hopeless 1  PHQ - 2 Score 3  Altered sleeping 3  Tired, decreased energy 2  Change in appetite 2  Feeling bad or failure about yourself  3  Trouble concentrating 3  Moving slowly or fidgety/restless 0  Suicidal thoughts 0  PHQ-9 Score 16     Review of Systems  Constitutional:       Weight gain  Gastrointestinal: Positive for constipation.  Genitourinary:       Urine retention  Neurological: Positive for numbness.  All other systems reviewed and are negative.      Objective:   Physical Exam  Constitutional: She is  oriented to person, place, and time. She appears well-developed and well-nourished.  HENT:  Head: Normocephalic and atraumatic.  Neck: Normal range of motion. Neck supple.  Cardiovascular: Normal rate and regular rhythm.   Pulmonary/Chest: Effort normal and breath sounds normal.  Musculoskeletal:  Normal Muscle Bulk and Muscle Testing Reveals: Upper Extremities: Full ROM and Muscle Strength 5/5 Lumbar Paraspinal Tenderness: L-3-L-5 Lower Extremities: Full ROM and Muscle Strength 5/5 Arises from chair with ease Narrow Based Gait   Neurological: She is alert and oriented to person, place, and time.  Skin: Skin is warm and dry.  Psychiatric: She has a normal mood and affect.  Nursing note and vitals reviewed.         Assessment & Plan:  1. Lumbar post lami syndrome.  With chronic L3 radiculitis.  Refilled:oxyCODONE  one tablet every 4 hours as needed #120. Alternate heat and ice therapy. Continue to use Voltaren gel and gabapentin  2. Constipation due to opioid therapy: RX: Movantik 12.5 mg Daily  20 minutes of face to face patient care time was spent during this visit. All questions were encouraged and answered.  F/U in 1 month

## 2015-01-22 ENCOUNTER — Ambulatory Visit (INDEPENDENT_AMBULATORY_CARE_PROVIDER_SITE_OTHER): Payer: Medicare Other | Admitting: Podiatry

## 2015-01-22 ENCOUNTER — Ambulatory Visit (INDEPENDENT_AMBULATORY_CARE_PROVIDER_SITE_OTHER): Payer: Medicare Other

## 2015-01-22 ENCOUNTER — Encounter: Payer: Self-pay | Admitting: Podiatry

## 2015-01-22 VITALS — BP 111/63 | HR 68 | Resp 12

## 2015-01-22 DIAGNOSIS — Z9889 Other specified postprocedural states: Secondary | ICD-10-CM

## 2015-01-22 DIAGNOSIS — M779 Enthesopathy, unspecified: Secondary | ICD-10-CM

## 2015-01-24 ENCOUNTER — Telehealth: Payer: Self-pay | Admitting: *Deleted

## 2015-01-24 NOTE — Progress Notes (Signed)
Subjective:     Patient ID: Shirley HeathElsie A Colvard, female   DOB: 06/05/1936, 79 y.o.   MRN: [id number]  HPI patient states her toes continuing to improve but she does have arch problems and would like to have orthotics   Review of Systems     Objective:   Physical Exam Neurovascular status intact with digits third and fourth right which are in good alignment and healing well with moderate edema still noted and moderate depression of the arch with pain in the arch noted    Assessment:     Continuing to improve from digital surgery right with moderate structural changes    Plan:     Reviewed x-rays and allow patient to return to soft shoes and at this time recommended orthotics which we will make next visit

## 2015-01-24 NOTE — Telephone Encounter (Signed)
Prior authorization for the Movantik was denied by insurance because the patient has not tried atleast 2 other OTC products.  Called the pharmacy to advise.

## 2015-02-06 ENCOUNTER — Other Ambulatory Visit: Payer: Self-pay | Admitting: Rheumatology

## 2015-02-06 DIAGNOSIS — Z780 Asymptomatic menopausal state: Secondary | ICD-10-CM

## 2015-02-07 ENCOUNTER — Other Ambulatory Visit: Payer: Self-pay | Admitting: Rheumatology

## 2015-02-07 DIAGNOSIS — E2839 Other primary ovarian failure: Secondary | ICD-10-CM

## 2015-02-13 ENCOUNTER — Ambulatory Visit
Admission: RE | Admit: 2015-02-13 | Discharge: 2015-02-13 | Disposition: A | Discharge status: Home / Self Care | Payer: Medicare Other | Source: Ambulatory Visit | Attending: Rheumatology | Admitting: Rheumatology

## 2015-02-13 DIAGNOSIS — E2839 Other primary ovarian failure: Secondary | ICD-10-CM

## 2015-02-19 ENCOUNTER — Telehealth: Payer: Self-pay | Admitting: *Deleted

## 2015-02-19 ENCOUNTER — Encounter: Payer: Medicare Other | Admitting: Podiatry

## 2015-02-19 NOTE — Telephone Encounter (Signed)
Patient request for movantik was denied.

## 2015-02-20 ENCOUNTER — Encounter: Payer: Medicare Other | Attending: Physical Medicine & Rehabilitation | Admitting: Registered Nurse

## 2015-02-20 ENCOUNTER — Encounter: Payer: Self-pay | Admitting: Registered Nurse

## 2015-02-20 ENCOUNTER — Other Ambulatory Visit: Payer: Self-pay | Admitting: Physical Medicine & Rehabilitation

## 2015-02-20 VITALS — BP 142/74 | HR 96 | Resp 14

## 2015-02-20 DIAGNOSIS — F4321 Adjustment disorder with depressed mood: Secondary | ICD-10-CM | POA: Diagnosis not present

## 2015-02-20 DIAGNOSIS — F99 Mental disorder, not otherwise specified: Secondary | ICD-10-CM | POA: Insufficient documentation

## 2015-02-20 DIAGNOSIS — G8929 Other chronic pain: Secondary | ICD-10-CM | POA: Insufficient documentation

## 2015-02-20 DIAGNOSIS — Z79899 Other long term (current) drug therapy: Secondary | ICD-10-CM | POA: Diagnosis not present

## 2015-02-20 DIAGNOSIS — M4316 Spondylolisthesis, lumbar region: Secondary | ICD-10-CM

## 2015-02-20 DIAGNOSIS — M1990 Unspecified osteoarthritis, unspecified site: Secondary | ICD-10-CM | POA: Diagnosis not present

## 2015-02-20 DIAGNOSIS — Z760 Encounter for issue of repeat prescription: Secondary | ICD-10-CM | POA: Insufficient documentation

## 2015-02-20 DIAGNOSIS — R51 Headache: Secondary | ICD-10-CM | POA: Insufficient documentation

## 2015-02-20 DIAGNOSIS — M79651 Pain in right thigh: Secondary | ICD-10-CM | POA: Insufficient documentation

## 2015-02-20 DIAGNOSIS — Z5181 Encounter for therapeutic drug level monitoring: Secondary | ICD-10-CM | POA: Diagnosis not present

## 2015-02-20 DIAGNOSIS — G709 Myoneural disorder, unspecified: Secondary | ICD-10-CM | POA: Insufficient documentation

## 2015-02-20 DIAGNOSIS — M545 Low back pain: Secondary | ICD-10-CM | POA: Diagnosis not present

## 2015-02-20 DIAGNOSIS — G894 Chronic pain syndrome: Secondary | ICD-10-CM

## 2015-02-20 DIAGNOSIS — M961 Postlaminectomy syndrome, not elsewhere classified: Secondary | ICD-10-CM | POA: Diagnosis not present

## 2015-02-20 DIAGNOSIS — H269 Unspecified cataract: Secondary | ICD-10-CM | POA: Insufficient documentation

## 2015-02-20 DIAGNOSIS — F419 Anxiety disorder, unspecified: Secondary | ICD-10-CM | POA: Insufficient documentation

## 2015-02-20 MED ORDER — OXYCODONE HCL 5 MG PO TABS: 5.0000 mg | ORAL_TABLET | ORAL | Status: DC | PRN

## 2015-02-20 MED ORDER — BACLOFEN 20 MG PO TABS: 20.0000 mg | ORAL_TABLET | Freq: Three times a day (TID) | ORAL | Status: DC | PRN

## 2015-02-20 NOTE — Progress Notes (Signed)
Subjective:    Patient ID: Shirley Hamilton, female    DOB: 1936/07/22, 79 y.o.   MRN: [id number]  HPI: [name] is a 79 year old female who returns for follow up for chronic pain and medication refill.She says her pain is located in her lower back and right thigh . She rates her pain 6. Her current exercise regime is walking.   Pain Inventory Average Pain 7 Pain Right Now 6 My pain is constant, sharp, dull, stabbing, tingling and aching  In the last 24 hours, has pain interfered with the following? General activity 5 Relation with others 5 Enjoyment of life 5 What TIME of day is your pain at its worst? ALL Sleep (in general) Fair  Pain is worse with: walking, bending, sitting, standing and some activites Pain improves with: rest and medication Relief from Meds: 5  Mobility walk without assistance how many minutes can you walk? 20 ability to climb steps?  yes do you drive?  yes  Function retired  Neuro/Psych weakness trouble walking depression anxiety  Prior Studies Any changes since last visit?  no  Physicians involved in your care Any changes since last visit?  no   History reviewed. No pertinent family history. History   Social History  . Marital Status: Married    Spouse Name: N/A  . Number of Children: N/A  . Years of Education: N/A   Social History Main Topics  . Smoking status: Never Smoker   . Smokeless tobacco: Never Used  . Alcohol Use: Yes     Comment: socially  . Drug Use: No  . Sexual Activity: Not on file   Other Topics Concern  . None   Social History Narrative   Past Surgical History  Procedure Laterality Date  . Spine surgery    . Knee surgery      right  . Cataract extraction, bilateral Bilateral    Past Medical History  Diagnosis Date  . Neuromuscular disorder   . Anxiety   . Mental disorder   . Arthritis   . Headache(784.0)   . Cataract     left and right   BP 142/74 mmHg  Pulse 96  Resp 14  SpO2  94%  Opioid Risk Score:   Fall Risk Score: Low Fall Risk (0-5 points)`1  Depression screen PHQ 2/9  Depression screen PHQ 2/9 01/19/2015  Decreased Interest 2  Down, Depressed, Hopeless 1  PHQ - 2 Score 3  Altered sleeping 3  Tired, decreased energy 2  Change in appetite 2  Feeling bad or failure about yourself  3  Trouble concentrating 3  Moving slowly or fidgety/restless 0  Suicidal thoughts 0  PHQ-9 Score 16     Review of Systems  HENT: Negative.   Eyes: Negative.   Respiratory: Negative.   Cardiovascular: Negative.   Gastrointestinal: Negative.   Endocrine: Negative.   Genitourinary: Negative.   Musculoskeletal: Positive for myalgias, back pain and arthralgias.  Skin: Negative.   Allergic/Immunologic: Negative.   Neurological: Positive for weakness.       Trouble walking, spasms  Hematological: Negative.   Psychiatric/Behavioral: Positive for dysphoric mood. The patient is nervous/anxious.        Objective:   Physical Exam  Constitutional: She is oriented to person, place, and time. She appears well-developed and well-nourished.  HENT:  Head: Normocephalic and atraumatic.  Neck: Normal range of motion. Neck supple.  Cardiovascular: Normal rate and regular rhythm.   Pulmonary/Chest: Effort normal and  breath sounds normal.  Musculoskeletal:  Normal Muscle Bulk and Muscle Testing Reveals: Upper Extremities: Full ROM and Muscle Strength 5/5 Lumbar Paraspinal Tenderness: L-3- L-5  ( Mainly Left Side) Lower Extremities: Full ROM and Muscle Strength 5/5 Arises from chair with ease Narrow Based Gait  Neurological: She is alert and oriented to person, place, and time.  Skin: Skin is warm and dry.  Psychiatric: She has a normal mood and affect.  Nursing note and vitals reviewed.         Assessment & Plan:  1. 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  Refilled:oxyCODONE 5mg  one tablet every 4 hours as needed #120. Alternate heat and ice  therapy. Continue to use Voltaren gel and gabapentin   20 minutes of face to face patient care time was spent during this visit. All questions were encouraged and answered.  F/U in 1 month

## 2015-02-21 LAB — PMP ALCOHOL METABOLITE (ETG): Ethyl Glucuronide (EtG): NEGATIVE ng/mL

## 2015-02-22 ENCOUNTER — Ambulatory Visit: Payer: Medicare Other | Admitting: Podiatry

## 2015-02-24 LAB — ZOLPIDEM (LC/MS-MS), URINE
Zolpidem (GC/LC/MS), Ur confirm: 116 ng/mL (ref <5)
Zolpidem metabolite (GC/LC/MS) Ur, confirm: 501 ng/mL (ref <5)

## 2015-02-24 LAB — OPIATES/OPIOIDS (LC/MS-MS)
Codeine Urine: NEGATIVE ng/mL (ref <50)
HYDROCODONE: NEGATIVE ng/mL (ref <50)
Hydromorphone: NEGATIVE ng/mL (ref <50)
MORPHINE: NEGATIVE ng/mL (ref <50)
NORHYDROCODONE, UR: NEGATIVE ng/mL (ref <50)
Noroxycodone, Ur: >10000 ng/mL (ref <50)
Oxycodone, ur: 9043 ng/mL (ref <50)
Oxymorphone: 551 ng/mL (ref <50)

## 2015-02-24 LAB — OXYCODONE, URINE (LC/MS-MS)
Noroxycodone, Ur: >10000 ng/mL (ref <50)
Oxycodone, ur: 9043 ng/mL (ref <50)
Oxymorphone: 551 ng/mL (ref <50)

## 2015-02-24 LAB — BENZODIAZEPINES (GC/LC/MS), URINE
Alprazolam metabolite (GC/LC/MS), ur confirm: NEGATIVE ng/mL (ref <25)
Clonazepam metabolite (GC/LC/MS), ur confirm: NEGATIVE ng/mL (ref <25)
FLURAZEPAMU: NEGATIVE ng/mL (ref <50)
Lorazepam (GC/LC/MS), ur confirm: NEGATIVE ng/mL (ref <50)
Midazolam (GC/LC/MS), ur confirm: NEGATIVE ng/mL (ref <50)
Nordiazepam (GC/LC/MS), ur confirm: 275 ng/mL (ref <50)
Oxazepam (GC/LC/MS), ur confirm: 925 ng/mL (ref <50)
Temazepam (GC/LC/MS), ur confirm: 1137 ng/mL (ref <50)
Triazolam metabolite (GC/LC/MS), ur confirm: NEGATIVE ng/mL (ref <50)

## 2015-02-24 LAB — AMPHETAMINES (GC/LC/MS), URINE
Amphetamine GC/MS Conf: NEGATIVE ng/mL (ref <250)
Methamphetamine Quant, Ur: NEGATIVE ng/mL (ref <250)

## 2015-02-27 LAB — PRESCRIPTION MONITORING PROFILE (SOLSTAS)
BARBITURATE SCREEN, URINE: NEGATIVE ng/mL
BUPRENORPHINE, URINE: NEGATIVE ng/mL
CANNABINOID SCRN UR: NEGATIVE ng/mL
COCAINE METABOLITES: NEGATIVE ng/mL
CREATININE, URINE: 122.1 mg/dL (ref >20.0)
Carisoprodol, Urine: NEGATIVE ng/mL
FENTANYL URINE: NEGATIVE ng/mL
MDMA URINE: NEGATIVE ng/mL
MEPERIDINE UR: NEGATIVE ng/mL
Methadone Screen, Urine: NEGATIVE ng/mL
Nitrites, Initial: NEGATIVE ug/mL
Propoxyphene: NEGATIVE ng/mL
Tapentadol, urine: NEGATIVE ng/mL
Tramadol Scrn, Ur: NEGATIVE ng/mL
pH, Initial: 5.2 pH (ref 4.5–8.9)

## 2015-03-01 ENCOUNTER — Ambulatory Visit (INDEPENDENT_AMBULATORY_CARE_PROVIDER_SITE_OTHER): Payer: Medicare Other

## 2015-03-01 ENCOUNTER — Ambulatory Visit (INDEPENDENT_AMBULATORY_CARE_PROVIDER_SITE_OTHER): Payer: Medicare Other | Admitting: Podiatry

## 2015-03-01 DIAGNOSIS — Z9889 Other specified postprocedural states: Secondary | ICD-10-CM

## 2015-03-01 DIAGNOSIS — M779 Enthesopathy, unspecified: Secondary | ICD-10-CM

## 2015-03-01 DIAGNOSIS — M2041 Other hammer toe(s) (acquired), right foot: Secondary | ICD-10-CM

## 2015-03-04 NOTE — Progress Notes (Signed)
Subjective:     Patient ID: Shirley HeathElsie A Grigorian, female   DOB: Mar 14, 1936, 79 y.o.   MRN: [id number]  HPI patient states my toes are doing well but my feet get tired and I need new orthotics   Review of Systems     Objective:   Physical Exam Neurovascular status intact with no change in health history and noted to have good digital position of the third and fourth toes right with good alignment of the digits noted. There is moderate depression of the arch with pain along the fascial band and also mild pain around posterior tibial tendon    Assessment:     Doing very well from preoperative surgery and tendinitis bilateral secondary to foot structure    Plan:     Final x-rays reviewed with patient and advised this patient on orthotics which were scanned today. Patient will be seen back when those are returned

## 2015-03-07 NOTE — Progress Notes (Signed)
Urine drug screen for this encounter is consistent for prescribed medication 

## 2015-03-20 NOTE — Progress Notes (Signed)
This encounter was created in error - please disregard.

## 2015-03-21 ENCOUNTER — Encounter: Payer: Self-pay | Admitting: Registered Nurse

## 2015-03-21 ENCOUNTER — Encounter: Payer: Medicare Other | Attending: Physical Medicine & Rehabilitation | Admitting: Registered Nurse

## 2015-03-21 VITALS — BP 120/69 | HR 68 | Resp 14

## 2015-03-21 DIAGNOSIS — Z5181 Encounter for therapeutic drug level monitoring: Secondary | ICD-10-CM | POA: Diagnosis not present

## 2015-03-21 DIAGNOSIS — M4316 Spondylolisthesis, lumbar region: Secondary | ICD-10-CM

## 2015-03-21 DIAGNOSIS — G8929 Other chronic pain: Secondary | ICD-10-CM | POA: Diagnosis not present

## 2015-03-21 DIAGNOSIS — F99 Mental disorder, not otherwise specified: Secondary | ICD-10-CM | POA: Insufficient documentation

## 2015-03-21 DIAGNOSIS — F419 Anxiety disorder, unspecified: Secondary | ICD-10-CM | POA: Diagnosis not present

## 2015-03-21 DIAGNOSIS — F4321 Adjustment disorder with depressed mood: Secondary | ICD-10-CM | POA: Diagnosis not present

## 2015-03-21 DIAGNOSIS — M961 Postlaminectomy syndrome, not elsewhere classified: Secondary | ICD-10-CM | POA: Diagnosis not present

## 2015-03-21 DIAGNOSIS — G709 Myoneural disorder, unspecified: Secondary | ICD-10-CM | POA: Insufficient documentation

## 2015-03-21 DIAGNOSIS — R51 Headache: Secondary | ICD-10-CM | POA: Diagnosis not present

## 2015-03-21 DIAGNOSIS — H269 Unspecified cataract: Secondary | ICD-10-CM | POA: Insufficient documentation

## 2015-03-21 DIAGNOSIS — M79651 Pain in right thigh: Secondary | ICD-10-CM | POA: Diagnosis not present

## 2015-03-21 DIAGNOSIS — Z760 Encounter for issue of repeat prescription: Secondary | ICD-10-CM | POA: Diagnosis not present

## 2015-03-21 DIAGNOSIS — Z79899 Other long term (current) drug therapy: Secondary | ICD-10-CM

## 2015-03-21 DIAGNOSIS — M545 Low back pain: Secondary | ICD-10-CM | POA: Diagnosis not present

## 2015-03-21 DIAGNOSIS — M1990 Unspecified osteoarthritis, unspecified site: Secondary | ICD-10-CM | POA: Diagnosis not present

## 2015-03-21 DIAGNOSIS — G894 Chronic pain syndrome: Secondary | ICD-10-CM | POA: Diagnosis not present

## 2015-03-21 MED ORDER — OXYCODONE HCL 5 MG PO TABS: 5.0000 mg | ORAL_TABLET | ORAL | Status: DC | PRN

## 2015-03-21 NOTE — Progress Notes (Signed)
Subjective:    Patient ID: Shirley Hamilton, female    Shirley HeathB: Apr 13, 1936, 79 y.o.   MRN: [id number]  HPI: [name] is a 79 year old female who returns for follow up for chronic pain and medication refill.She says her pain is located in her lower back and right thigh . She rates her pain 7. She's not following a current exercise regime at this time, she will be resuming her exercise this week.  Pain Inventory Average Pain 8 Pain Right Now 7 My pain is sharp, dull, stabbing and tingling  In the last 24 hours, has pain interfered with the following? General activity 7 Relation with others 7 Enjoyment of life 7 What TIME of day is your pain at its worst? morning, evening, night Sleep (in general) Poor  Pain is worse with: bending, standing and some activites Pain improves with: rest and medication Relief from Meds: 6  Mobility walk without assistance how many minutes can you walk? 20+ ability to climb steps?  yes do you drive?  yes transfers alone Do you have any goals in this area?  yes  Function retired  Neuro/Psych numbness tingling spasms  Prior Studies Any changes since last visit?  no  Physicians involved in your care Any changes since last visit?  no   History reviewed. No pertinent family history. History   Social History  . Marital Status: Married    Spouse Name: N/A  . Number of Children: N/A  . Years of Education: N/A   Social History Main Topics  . Smoking status: Never Smoker   . Smokeless tobacco: Never Used  . Alcohol Use: Yes     Comment: socially  . Drug Use: No  . Sexual Activity: Not on file   Other Topics Concern  . None   Social History Narrative   Past Surgical History  Procedure Laterality Date  . Spine surgery    . Knee surgery      right  . Cataract extraction, bilateral Bilateral    Past Medical History  Diagnosis Date  . Neuromuscular disorder   . Anxiety   . Mental disorder   . Arthritis   .  Headache(784.0)   . Cataract     left and right   BP 120/69 mmHg  Pulse 68  Resp 14  SpO2 95%  Opioid Risk Score:   Fall Risk Score: Moderate Fall Risk (6-13 points)`1  Depression screen PHQ 2/9  Depression screen PHQ 2/9 01/19/2015  Decreased Interest 2  Down, Depressed, Hopeless 1  PHQ - 2 Score 3  Altered sleeping 3  Tired, decreased energy 2  Change in appetite 2  Feeling bad or failure about yourself  3  Trouble concentrating 3  Moving slowly or fidgety/restless 0  Suicidal thoughts 0  PHQ-9 Score 16      Review of Systems  Constitutional:       Weight loss  Neurological: Positive for numbness.       Tingling Spasms   All other systems reviewed and are negative.      Objective:   Physical Exam  Constitutional: She is oriented to person, place, and time. She appears well-developed and well-nourished.  HENT:  Head: Normocephalic and atraumatic.  Neck: Normal range of motion. Neck supple.  Cardiovascular: Normal rate and regular rhythm.   Pulmonary/Chest: Effort normal and breath sounds normal.  Musculoskeletal:  Normal Muscle Bulk and Muscle Testing Reveals: Upper Extremities: Full ROM and Muscle Strength 5/5 Back without  spinal or paraspinal tenderness Lower Extremities: Full ROM and Muscle Strength 5/5 Arises from chair with ease Narrow Based Gait  Neurological: She is alert and oriented to person, place, and time.  Skin: Skin is warm and dry.  Psychiatric: She has a normal mood and affect.  Nursing note and vitals reviewed.         Assessment & Plan:  1. 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  Refilled:oxyCODONE  one tablet every 4 hours as needed #120. Second script given to accommodate scheduled appointment. Alternate heat and ice therapy. Continue to use Voltaren gel and gabapentin   20 minutes of face to face patient care time was spent during this visit. All questions were encouraged and answered.  F/U in 1  month

## 2015-05-03 ENCOUNTER — Encounter: Payer: Medicare Other | Attending: Physical Medicine & Rehabilitation | Admitting: Registered Nurse

## 2015-05-03 ENCOUNTER — Encounter: Payer: Self-pay | Admitting: Registered Nurse

## 2015-05-03 VITALS — BP 120/66 | HR 66 | Resp 14

## 2015-05-03 DIAGNOSIS — M1990 Unspecified osteoarthritis, unspecified site: Secondary | ICD-10-CM | POA: Insufficient documentation

## 2015-05-03 DIAGNOSIS — F419 Anxiety disorder, unspecified: Secondary | ICD-10-CM | POA: Diagnosis not present

## 2015-05-03 DIAGNOSIS — M79651 Pain in right thigh: Secondary | ICD-10-CM | POA: Insufficient documentation

## 2015-05-03 DIAGNOSIS — M961 Postlaminectomy syndrome, not elsewhere classified: Secondary | ICD-10-CM | POA: Diagnosis not present

## 2015-05-03 DIAGNOSIS — Z5181 Encounter for therapeutic drug level monitoring: Secondary | ICD-10-CM

## 2015-05-03 DIAGNOSIS — G709 Myoneural disorder, unspecified: Secondary | ICD-10-CM | POA: Diagnosis not present

## 2015-05-03 DIAGNOSIS — R51 Headache: Secondary | ICD-10-CM | POA: Insufficient documentation

## 2015-05-03 DIAGNOSIS — M4316 Spondylolisthesis, lumbar region: Secondary | ICD-10-CM | POA: Diagnosis not present

## 2015-05-03 DIAGNOSIS — M545 Low back pain: Secondary | ICD-10-CM | POA: Insufficient documentation

## 2015-05-03 DIAGNOSIS — F99 Mental disorder, not otherwise specified: Secondary | ICD-10-CM | POA: Diagnosis not present

## 2015-05-03 DIAGNOSIS — M659 Synovitis and tenosynovitis, unspecified: Secondary | ICD-10-CM | POA: Diagnosis not present

## 2015-05-03 DIAGNOSIS — F4321 Adjustment disorder with depressed mood: Secondary | ICD-10-CM | POA: Insufficient documentation

## 2015-05-03 DIAGNOSIS — Z79899 Other long term (current) drug therapy: Secondary | ICD-10-CM

## 2015-05-03 DIAGNOSIS — G8929 Other chronic pain: Secondary | ICD-10-CM | POA: Insufficient documentation

## 2015-05-03 DIAGNOSIS — H269 Unspecified cataract: Secondary | ICD-10-CM | POA: Diagnosis not present

## 2015-05-03 DIAGNOSIS — Z760 Encounter for issue of repeat prescription: Secondary | ICD-10-CM | POA: Diagnosis not present

## 2015-05-03 MED ORDER — METHYLPREDNISOLONE 4 MG PO TBPK: ORAL_TABLET | ORAL | Status: DC

## 2015-05-03 MED ORDER — OXYCODONE HCL 5 MG PO TABS: 5.0000 mg | ORAL_TABLET | ORAL | Status: DC | PRN

## 2015-05-03 NOTE — Progress Notes (Signed)
Subjective:    Patient ID: Shirley Hamilton, female    Shirley HeathB: [birth year], 79 y.o.   MRN: [id number]  HPI: [name] is a 79 year old female who returns for follow up for chronic pain and medication refill. She says her pain is located in her neck ( left side), lower back, right thigh and left wrist . She rates her pain 8. Also states her pain in her wrist has intensified and asked for increase in medications. She states her pain has increased with housework encouraged to do house work in intervals she verbalizes understanding. Medications will remain the same, she verbalizes understanding. Her current exercise regime is walking.  Pain Inventory Average Pain 8 Pain Right Now 8 My pain is constant, sharp, dull, stabbing and aching  In the last 24 hours, has pain interfered with the following? General activity 8 Relation with others 8 Enjoyment of life 7 What TIME of day is your pain at its worst? all Sleep (in general) Fair  Pain is worse with: bending, sitting and standing Pain improves with: rest and medication Relief from Meds: 4  Mobility walk without assistance how many minutes can you walk? 15 ability to climb steps?  yes do you drive?  yes Do you have any goals in this area?  yes  Function retired I need assistance with the following:  household duties Do you have any goals in this area?  yes  Neuro/Psych tremor spasms anxiety  Prior Studies Any changes since last visit?  no  Physicians involved in your care Any changes since last visit?  no   History reviewed. No pertinent family history. History   Social History  . Marital Status: Married    Spouse Name: N/A  . Number of Children: N/A  . Years of Education: N/A   Social History Main Topics  . Smoking status: Never Smoker   . Smokeless tobacco: Never Used  . Alcohol Use: Yes     Comment: socially  . Drug Use: No  . Sexual Activity: Not on file   Other Topics Concern  . None   Social  History Narrative   Past Surgical History  Procedure Laterality Date  . Spine surgery    . Knee surgery      right  . Cataract extraction, bilateral Bilateral    Past Medical History  Diagnosis Date  . Neuromuscular disorder   . Anxiety   . Mental disorder   . Arthritis   . Headache(784.0)   . Cataract     left and right   BP 120/66 mmHg  Pulse 66  Resp 14  SpO2 95%  Opioid Risk Score:   Fall Risk Score: Moderate Fall Risk (6-13 points)`1  Depression screen PHQ 2/9  Depression screen PHQ 2/9 01/19/2015  Decreased Interest 2  Down, Depressed, Hopeless 1  PHQ - 2 Score 3  Altered sleeping 3  Tired, decreased energy 2  Change in appetite 2  Feeling bad or failure about yourself  3  Trouble concentrating 3  Moving slowly or fidgety/restless 0  Suicidal thoughts 0  PHQ-9 Score 16     Review of Systems  Neurological: Positive for tremors.       Spasms  Psychiatric/Behavioral: The patient is nervous/anxious.   All other systems reviewed and are negative.      Objective:   Physical Exam  Constitutional: She is oriented to person, place, and time. She appears well-developed and well-nourished.  HENT:  Head: Normocephalic and  atraumatic.  Neck: Normal range of motion. Neck supple.  Cervical Paraspinal Tenderness: C-5- C-6  Cardiovascular: Normal rate and regular rhythm.   Pulmonary/Chest: Effort normal and breath sounds normal.  Musculoskeletal:  Normal Muscle Bulk and Muscle Testing Reveals: Upper Extremities: Full ROM and Muscle Strength 5/5 Thoracic Paraspinal Tenderness: T-7-T-9 Lower Extremities: Full ROM and Muscle Strength 5/5 Arises from chair with ease Narrow Based gait   Neurological: She is alert and oriented to person, place, and time.  Skin: Skin is warm and dry.  Psychiatric: She has a normal mood and affect.  Nursing note and vitals reviewed.         Assessment & Plan:  1. 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   Refilled:oxyCODONE  one tablet every 4 hours as needed #120. Alternate heat and ice therapy. Continue to use Voltaren gel and gabapentin  2. Tenosynovitis: RX: Medrol Dose Pak  20 minutes of face to face patient care time was spent during this visit. All questions were encouraged and answered.  F/U in 1 month

## 2015-05-16 ENCOUNTER — Other Ambulatory Visit: Payer: Self-pay | Admitting: *Deleted

## 2015-05-16 ENCOUNTER — Ambulatory Visit: Payer: Medicare Other | Admitting: *Deleted

## 2015-05-16 DIAGNOSIS — M779 Enthesopathy, unspecified: Secondary | ICD-10-CM

## 2015-05-16 MED ORDER — DIAZEPAM 5 MG PO TABS: 5.0000 mg | ORAL_TABLET | Freq: Two times a day (BID) | ORAL | Status: DC

## 2015-05-16 NOTE — Progress Notes (Signed)
Patient ID: Shirley Hamilton, female   DOB: 02/20/1936, 79 y.o.   MRN: [id number] Patient presents for orthotic pick up.  Verbal and written break in and wear instructions given.  Patient will follow up in 4 weeks if symptoms worsen or fail to improve.

## 2015-05-16 NOTE — Patient Instructions (Signed)

## 2015-06-04 ENCOUNTER — Encounter: Payer: Self-pay | Admitting: Registered Nurse

## 2015-06-04 ENCOUNTER — Encounter: Payer: Medicare Other | Attending: Physical Medicine & Rehabilitation | Admitting: Registered Nurse

## 2015-06-04 VITALS — BP 123/72 | HR 67

## 2015-06-04 DIAGNOSIS — M961 Postlaminectomy syndrome, not elsewhere classified: Secondary | ICD-10-CM | POA: Diagnosis not present

## 2015-06-04 DIAGNOSIS — G8929 Other chronic pain: Secondary | ICD-10-CM | POA: Insufficient documentation

## 2015-06-04 DIAGNOSIS — Z760 Encounter for issue of repeat prescription: Secondary | ICD-10-CM | POA: Insufficient documentation

## 2015-06-04 DIAGNOSIS — F419 Anxiety disorder, unspecified: Secondary | ICD-10-CM | POA: Insufficient documentation

## 2015-06-04 DIAGNOSIS — M545 Low back pain: Secondary | ICD-10-CM | POA: Insufficient documentation

## 2015-06-04 DIAGNOSIS — M4316 Spondylolisthesis, lumbar region: Secondary | ICD-10-CM | POA: Diagnosis not present

## 2015-06-04 DIAGNOSIS — G709 Myoneural disorder, unspecified: Secondary | ICD-10-CM | POA: Diagnosis not present

## 2015-06-04 DIAGNOSIS — F4321 Adjustment disorder with depressed mood: Secondary | ICD-10-CM | POA: Insufficient documentation

## 2015-06-04 DIAGNOSIS — Z5181 Encounter for therapeutic drug level monitoring: Secondary | ICD-10-CM | POA: Diagnosis not present

## 2015-06-04 DIAGNOSIS — R51 Headache: Secondary | ICD-10-CM | POA: Insufficient documentation

## 2015-06-04 DIAGNOSIS — M79651 Pain in right thigh: Secondary | ICD-10-CM | POA: Diagnosis not present

## 2015-06-04 DIAGNOSIS — H269 Unspecified cataract: Secondary | ICD-10-CM | POA: Insufficient documentation

## 2015-06-04 DIAGNOSIS — G894 Chronic pain syndrome: Secondary | ICD-10-CM | POA: Diagnosis not present

## 2015-06-04 DIAGNOSIS — M1990 Unspecified osteoarthritis, unspecified site: Secondary | ICD-10-CM | POA: Diagnosis not present

## 2015-06-04 DIAGNOSIS — F99 Mental disorder, not otherwise specified: Secondary | ICD-10-CM | POA: Diagnosis not present

## 2015-06-04 DIAGNOSIS — Z79899 Other long term (current) drug therapy: Secondary | ICD-10-CM

## 2015-06-04 MED ORDER — OXYCODONE HCL 5 MG PO TABS: 5.0000 mg | ORAL_TABLET | ORAL | Status: DC | PRN

## 2015-06-04 NOTE — Progress Notes (Signed)
Subjective:    Patient ID: Shirley Hamilton, female    DOB: [birth year], 79 y.o.   MRN: [id number]  HPI: [name] is a 79 year old female who returns for follow up for chronic pain and medication refill. She says her pain is located in her neck ( left side) and lower back . Also states she's having tingling and numbness in her upper extremities. She's going to follow up with Dr. Danielle Dess will continue gabapentin she verbalizes understanding.She rates her pain 8. Her current exercise regime is walking.  Pain Inventory Average Pain 7 Pain Right Now 8 My pain is sharp, dull, stabbing and tingling  In the last 24 hours, has pain interfered with the following? General activity 6 Relation with others 6 Enjoyment of life 6 What TIME of day is your pain at its worst? daytime Sleep (in general) Fair  Pain is worse with: bending and standing Pain improves with: rest and medication Relief from Meds: 5  Mobility walk without assistance how many minutes can you walk? 15-30 ability to climb steps?  yes do you drive?  yes  Function retired I need assistance with the following:  household duties  Neuro/Psych numbness tingling spasms  Prior Studies Any changes since last visit?  no  Physicians involved in your care Any changes since last visit?  no   History reviewed. No pertinent family history. Social History   Social History  . Marital Status: Married    Spouse Name: N/A  . Number of Children: N/A  . Years of Education: N/A   Social History Main Topics  . Smoking status: Never Smoker   . Smokeless tobacco: Never Used  . Alcohol Use: Yes     Comment: socially  . Drug Use: No  . Sexual Activity: Not Asked   Other Topics Concern  . None   Social History Narrative   Past Surgical History  Procedure Laterality Date  . Spine surgery    . Knee surgery      right  . Cataract extraction, bilateral Bilateral    Past Medical History  Diagnosis Date  .  Neuromuscular disorder   . Anxiety   . Mental disorder   . Arthritis   . Headache(784.0)   . Cataract     left and right   BP 123/72 mmHg  Pulse 67  SpO2 95%  Opioid Risk Score:   Fall Risk Score:  `1  Depression screen PHQ 2/9  Depression screen [facility] 2/9 06/04/2015 01/19/2015  Decreased Interest 2 2  Down, Depressed, Hopeless 1 1  PHQ - 2 Score 3 3  Altered sleeping - 3  Tired, decreased energy - 2  Change in appetite - 2  Feeling bad or failure about yourself  - 3  Trouble concentrating - 3  Moving slowly or fidgety/restless - 0  Suicidal thoughts - 0  PHQ-9 Score - 16      Review of Systems  Constitutional: Positive for unexpected weight change.  Musculoskeletal:       Spasms  Neurological: Positive for numbness.       Tingling  All other systems reviewed and are negative.      Objective:   Physical Exam  Constitutional: She is oriented to person, place, and time. She appears well-developed and well-nourished.  HENT:  Head: Normocephalic and atraumatic.  Neck: Normal range of motion. Neck supple.  Cardiovascular: Normal rate and regular rhythm.   Pulmonary/Chest: Effort normal and breath sounds normal.  Musculoskeletal:  Normal Muscle Bulk and Muscle Testing Reveals: Upper Extremities: Full ROM and Muscle Strength 5/5 Lumbar Paraspinal Tenderness: L-3- L-5 Lower Extremities: Full ROM and Muscle Strength 5/5 Arises from chair with ease Narrow Based gait  Neurological: She is alert and oriented to person, place, and time.  Skin: Skin is warm and dry.  Psychiatric: She has a normal mood and affect.  Nursing note and vitals reviewed.         Assessment & Plan:  1. 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  Refilled:oxyCODONE 5mg  one tablet every 4 hours as needed #120. Alternate heat and ice therapy. Continue to use Voltaren gel and gabapentin   20 minutes of face to face patient care time was spent during this visit. All questions  were encouraged and answered.  F/U in 1 month

## 2015-06-05 ENCOUNTER — Other Ambulatory Visit: Payer: Self-pay | Admitting: Registered Nurse

## 2015-07-02 ENCOUNTER — Encounter: Payer: Self-pay | Admitting: Registered Nurse

## 2015-07-02 ENCOUNTER — Encounter: Payer: Medicare Other | Attending: Physical Medicine & Rehabilitation | Admitting: Registered Nurse

## 2015-07-02 VITALS — BP 134/69 | HR 63

## 2015-07-02 DIAGNOSIS — M79651 Pain in right thigh: Secondary | ICD-10-CM | POA: Diagnosis not present

## 2015-07-02 DIAGNOSIS — Z5181 Encounter for therapeutic drug level monitoring: Secondary | ICD-10-CM | POA: Diagnosis not present

## 2015-07-02 DIAGNOSIS — R51 Headache: Secondary | ICD-10-CM | POA: Diagnosis not present

## 2015-07-02 DIAGNOSIS — F419 Anxiety disorder, unspecified: Secondary | ICD-10-CM | POA: Insufficient documentation

## 2015-07-02 DIAGNOSIS — G8929 Other chronic pain: Secondary | ICD-10-CM | POA: Insufficient documentation

## 2015-07-02 DIAGNOSIS — M961 Postlaminectomy syndrome, not elsewhere classified: Secondary | ICD-10-CM | POA: Diagnosis not present

## 2015-07-02 DIAGNOSIS — Z79899 Other long term (current) drug therapy: Secondary | ICD-10-CM

## 2015-07-02 DIAGNOSIS — G709 Myoneural disorder, unspecified: Secondary | ICD-10-CM | POA: Diagnosis not present

## 2015-07-02 DIAGNOSIS — M545 Low back pain: Secondary | ICD-10-CM | POA: Diagnosis not present

## 2015-07-02 DIAGNOSIS — F4321 Adjustment disorder with depressed mood: Secondary | ICD-10-CM | POA: Insufficient documentation

## 2015-07-02 DIAGNOSIS — G894 Chronic pain syndrome: Secondary | ICD-10-CM | POA: Diagnosis not present

## 2015-07-02 DIAGNOSIS — H269 Unspecified cataract: Secondary | ICD-10-CM | POA: Insufficient documentation

## 2015-07-02 DIAGNOSIS — Z760 Encounter for issue of repeat prescription: Secondary | ICD-10-CM | POA: Diagnosis present

## 2015-07-02 DIAGNOSIS — M1990 Unspecified osteoarthritis, unspecified site: Secondary | ICD-10-CM | POA: Insufficient documentation

## 2015-07-02 DIAGNOSIS — F99 Mental disorder, not otherwise specified: Secondary | ICD-10-CM | POA: Diagnosis not present

## 2015-07-02 DIAGNOSIS — M4316 Spondylolisthesis, lumbar region: Secondary | ICD-10-CM | POA: Diagnosis not present

## 2015-07-02 MED ORDER — BACLOFEN 20 MG PO TABS: ORAL_TABLET | ORAL | Status: DC

## 2015-07-02 MED ORDER — OXYCODONE HCL 5 MG PO TABS: 5.0000 mg | ORAL_TABLET | ORAL | Status: DC | PRN

## 2015-07-02 NOTE — Progress Notes (Signed)
Subjective:    Patient ID: Shirley Hamilton, female    DOB: 1936/04/24, 79 y.o.   MRN: [id number]  HPI:[name] is a 79 year old female who returns for follow up for chronic pain and medication refill. She says her pain is located in her right thigh and lower back .She rates her pain 8. Her current exercise regime is walking and performing stretching exercises.  Pain Inventory Average Pain 7 Pain Right Now 8 My pain is intermittent and burning  In the last 24 hours, has pain interfered with the following? General activity 4 Relation with others 4 Enjoyment of life 4 What TIME of day is your pain at its worst? daytime, evening Sleep (in general) Poor  Pain is worse with: bending, sitting and standing Pain improves with: rest, pacing activities and medication Relief from Meds: 6  Mobility walk without assistance ability to climb steps?  yes do you drive?  yes transfers alone Do you have any goals in this area?  yes  Function retired  Neuro/Psych numbness tingling spasms depression anxiety  Prior Studies Any changes since last visit?  no bone scan x-rays CT/MRI  Physicians involved in your care Any changes since last visit?  yes Primary care [facility] Ade Neurologist Dr.Deveshwar Rheumatologist [facility] Neurosurgeon Regal Pola Corn   History reviewed. No pertinent family history. Social History   Social History  . Marital Status: Married    Spouse Name: N/A  . Number of Children: N/A  . Years of Education: N/A   Social History Main Topics  . Smoking status: Never Smoker   . Smokeless tobacco: Never Used  . Alcohol Use: Yes     Comment: socially  . Drug Use: No  . Sexual Activity: Not Asked   Other Topics Concern  . None   Social History Narrative   Past Surgical History  Procedure Laterality Date  . Spine surgery    . Knee surgery      right  . Cataract extraction, bilateral Bilateral    Past Medical History  Diagnosis  Date  . Neuromuscular disorder   . Anxiety   . Mental disorder   . Arthritis   . Headache(784.0)   . Cataract     left and right   BP 134/69 mmHg  Pulse 63  SpO2 95%  Opioid Risk Score:   Fall Risk Score:  `1  Depression screen PHQ 2/9  Depression screen [facility] 2/9 07/02/2015 06/04/2015 01/19/2015  Decreased Interest 0 2 2  Down, Depressed, Hopeless 0 1 1  PHQ - 2 Score 0 3 3  Altered sleeping - - 3  Tired, decreased energy - - 2  Change in appetite - - 2  Feeling bad or failure about yourself  - - 3  Trouble concentrating - - 3  Moving slowly or fidgety/restless - - 0  Suicidal thoughts - - 0  PHQ-9 Score - - 16     Review of Systems  Constitutional: Positive for unexpected weight change.  Gastrointestinal: Positive for constipation.  All other systems reviewed and are negative.      Objective:   Physical Exam  Constitutional: She is oriented to person, place, and time. She appears well-developed and well-nourished.  HENT:  Head: Normocephalic and atraumatic.  Neck: Normal range of motion. Neck supple.  Cardiovascular: Normal rate and regular rhythm.   Pulmonary/Chest: Effort normal and breath sounds normal.  Musculoskeletal:  Normal Muscle Bulk and Muscle Testing Reveals: Upper Extremities: Full ROM and  Muscle Strength 5/5 Back without spinal or paraspinal tenderness Lower Extremities: Full ROM and Muscle Strength 5/5 Arises from chair with ease  Narrow Based Gait   Neurological: She is alert and oriented to person, place, and time.  Skin: Skin is warm and dry.  Psychiatric: She has a normal mood and affect.  Nursing note and vitals reviewed.         Assessment & Plan:  1. 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  Refilled:oxyCODONE 5mg  one tablet every 4 hours as needed #120. Alternate heat and ice therapy. Continue to use Voltaren gel and gabapentin   20 minutes of face to face patient care time was spent during this visit. All  questions were encouraged and answered.  F/U in 1 month

## 2015-07-03 ENCOUNTER — Other Ambulatory Visit: Payer: Self-pay | Admitting: Physical Medicine & Rehabilitation

## 2015-07-03 NOTE — Addendum Note (Signed)
Addended by: Doreene Eland on: 07/03/2015 11:41 AM   Modules accepted: Orders

## 2015-07-04 LAB — PMP ALCOHOL METABOLITE (ETG): Ethyl Glucuronide (EtG): NEGATIVE ng/mL

## 2015-07-08 LAB — OPIATES/OPIOIDS (LC/MS-MS)
CODEINE URINE: NEGATIVE ng/mL (ref <50)
Hydrocodone: NEGATIVE ng/mL (ref <50)
Hydromorphone: NEGATIVE ng/mL (ref <50)
MORPHINE: NEGATIVE ng/mL (ref <50)
NORHYDROCODONE, UR: NEGATIVE ng/mL (ref <50)
Noroxycodone, Ur: 3630 ng/mL (ref <50)
OXYMORPHONE, URINE: 243 ng/mL (ref <50)
Oxycodone, ur: 1738 ng/mL (ref <50)

## 2015-07-08 LAB — BENZODIAZEPINES (GC/LC/MS), URINE
Alprazolam metabolite (GC/LC/MS), ur confirm: NEGATIVE ng/mL (ref <25)
Clonazepam metabolite (GC/LC/MS), ur confirm: NEGATIVE ng/mL (ref <25)
FLURAZEPAMU: NEGATIVE ng/mL (ref <50)
LORAZEPAMU: NEGATIVE ng/mL (ref <50)
MIDAZOLAMU: NEGATIVE ng/mL (ref <50)
Nordiazepam (GC/LC/MS), ur confirm: 696 ng/mL (ref <50)
Oxazepam (GC/LC/MS), ur confirm: 1777 ng/mL (ref <50)
TRIAZOLAMU: NEGATIVE ng/mL (ref <50)
Temazepam (GC/LC/MS), ur confirm: 1796 ng/mL (ref <50)

## 2015-07-08 LAB — OXYCODONE, URINE (LC/MS-MS)
Noroxycodone, Ur: 3630 ng/mL (ref <50)
Oxycodone, ur: 1738 ng/mL (ref <50)
Oxymorphone: 243 ng/mL (ref <50)

## 2015-07-08 LAB — ZOLPIDEM (LC/MS-MS), URINE
ZOLPIDEM (GC/LC/MS), UR CONFIRM: 24 ng/mL — AB (ref <5)
Zolpidem metabolite (GC/LC/MS) Ur, confirm: 1340 ng/mL — AB (ref <5)

## 2015-07-10 LAB — PRESCRIPTION MONITORING PROFILE (SOLSTAS)
Amphetamine/Meth: NEGATIVE ng/mL
BUPRENORPHINE, URINE: NEGATIVE ng/mL
Barbiturate Screen, Urine: NEGATIVE ng/mL
CARISOPRODOL, URINE: NEGATIVE ng/mL
Cannabinoid Scrn, Ur: NEGATIVE ng/mL
Cocaine Metabolites: NEGATIVE ng/mL
Creatinine, Urine: 68.16 mg/dL (ref >20.0)
Fentanyl, Ur: NEGATIVE ng/mL
MDMA URINE: NEGATIVE ng/mL
Meperidine, Ur: NEGATIVE ng/mL
Methadone Screen, Urine: NEGATIVE ng/mL
NITRITES URINE, INITIAL: NEGATIVE ug/mL
PH URINE, INITIAL: 5.5 pH (ref 4.5–8.9)
Propoxyphene: NEGATIVE ng/mL
Tapentadol, urine: NEGATIVE ng/mL
Tramadol Scrn, Ur: NEGATIVE ng/mL

## 2015-07-18 NOTE — Progress Notes (Signed)
Urine drug screen for this encounter is consistent for prescribed medication 

## 2015-07-30 ENCOUNTER — Encounter: Payer: Medicare Other | Attending: Physical Medicine & Rehabilitation | Admitting: Registered Nurse

## 2015-07-30 ENCOUNTER — Encounter: Payer: Self-pay | Admitting: Registered Nurse

## 2015-07-30 VITALS — BP 133/70 | HR 67

## 2015-07-30 DIAGNOSIS — M961 Postlaminectomy syndrome, not elsewhere classified: Secondary | ICD-10-CM

## 2015-07-30 DIAGNOSIS — F4321 Adjustment disorder with depressed mood: Secondary | ICD-10-CM | POA: Insufficient documentation

## 2015-07-30 DIAGNOSIS — G709 Myoneural disorder, unspecified: Secondary | ICD-10-CM | POA: Insufficient documentation

## 2015-07-30 DIAGNOSIS — F99 Mental disorder, not otherwise specified: Secondary | ICD-10-CM | POA: Insufficient documentation

## 2015-07-30 DIAGNOSIS — Z79899 Other long term (current) drug therapy: Secondary | ICD-10-CM

## 2015-07-30 DIAGNOSIS — G894 Chronic pain syndrome: Secondary | ICD-10-CM

## 2015-07-30 DIAGNOSIS — R51 Headache: Secondary | ICD-10-CM | POA: Insufficient documentation

## 2015-07-30 DIAGNOSIS — Z760 Encounter for issue of repeat prescription: Secondary | ICD-10-CM | POA: Insufficient documentation

## 2015-07-30 DIAGNOSIS — Z5181 Encounter for therapeutic drug level monitoring: Secondary | ICD-10-CM

## 2015-07-30 DIAGNOSIS — M1990 Unspecified osteoarthritis, unspecified site: Secondary | ICD-10-CM | POA: Diagnosis not present

## 2015-07-30 DIAGNOSIS — H269 Unspecified cataract: Secondary | ICD-10-CM | POA: Diagnosis not present

## 2015-07-30 DIAGNOSIS — M545 Low back pain: Secondary | ICD-10-CM | POA: Diagnosis not present

## 2015-07-30 DIAGNOSIS — M79651 Pain in right thigh: Secondary | ICD-10-CM | POA: Diagnosis not present

## 2015-07-30 DIAGNOSIS — G8929 Other chronic pain: Secondary | ICD-10-CM | POA: Insufficient documentation

## 2015-07-30 DIAGNOSIS — M4316 Spondylolisthesis, lumbar region: Secondary | ICD-10-CM

## 2015-07-30 DIAGNOSIS — F419 Anxiety disorder, unspecified: Secondary | ICD-10-CM | POA: Insufficient documentation

## 2015-07-30 MED ORDER — BACLOFEN 20 MG PO TABS: ORAL_TABLET | ORAL | Status: AC

## 2015-07-30 MED ORDER — GABAPENTIN 300 MG PO CAPS: 300.0000 mg | ORAL_CAPSULE | Freq: Three times a day (TID) | ORAL | Status: AC

## 2015-07-30 MED ORDER — OXYCODONE HCL 5 MG PO TABS: 5.0000 mg | ORAL_TABLET | ORAL | Status: AC | PRN

## 2015-07-30 NOTE — Progress Notes (Signed)
Subjective:    Patient ID: Shirley Hamilton, female    DOB: [birth year], 79 y.o.   MRN: [id number]  HPI: Shirley Hamilton is a 79 year old female who returns for follow up for chronic pain and medication refill. She says her pain is located in her right thigh and lower back .She rates her pain 9. Her current exercise regime is walking and performing stretching exercises. Shirley Hamilton will be moving to [place], [place] on 08/17/2015. I have been speaking to Shirley Hamilton for the last few months she needed to obtain a [employer] and they would have to place a referral for Pain Management. This has not been achieved. I reiterated to have her daughter find her a PCP and hopefully they will prescribe her analgesics until she is able to find Pain Management Clinic she verbalizes understanding.   Pain Inventory Average Pain 7 Pain Right Now 9 My pain is intermittent, sharp, burning, dull, stabbing, tingling and aching  In the last 24 hours, has pain interfered with the following? General activity 7 Relation with others 7 Enjoyment of life 7 What TIME of day is your pain at its worst? all Sleep (in general) Poor  Pain is worse with: bending, sitting, standing and some activites Pain improves with: rest and pacing activities Relief from Meds: 6  Mobility walk without assistance how many minutes can you walk? 20 ability to climb steps?  yes do you drive?  yes  Function retired  Neuro/Psych weakness numbness tremor tingling spasms  Prior Studies Any changes since last visit?  no  Physicians involved in your care Any changes since last visit?  no   History reviewed. No pertinent family history. Social History   Social History  . Marital Status: Married    Spouse Name: N/A  . Number of Children: N/A  . Years of Education: N/A   Social History Main Topics  . Smoking status: Never Smoker   . Smokeless tobacco: Never Used  . Alcohol Use: Yes     Comment: socially  .  Drug Use: No  . Sexual Activity: Not Asked   Other Topics Concern  . None   Social History Narrative   Past Surgical History  Procedure Laterality Date  . Spine surgery    . Knee surgery      right  . Cataract extraction, bilateral Bilateral    Past Medical History  Diagnosis Date  . Neuromuscular disorder (HCC)   . Anxiety   . Mental disorder   . Arthritis   . Headache(784.0)   . Cataract     left and right   BP 133/70 mmHg  Pulse 67  SpO2 97%  Opioid Risk Score:   Fall Risk Score:  `1  Depression screen PHQ 2/9  Depression screen [facility] 2/9 07/02/2015 06/04/2015 01/19/2015  Decreased Interest 0 2 2  Down, Depressed, Hopeless 0 1 1  PHQ - 2 Score 0 3 3  Altered sleeping - - 3  Tired, decreased energy - - 2  Change in appetite - - 2  Feeling bad or failure about yourself  - - 3  Trouble concentrating - - 3  Moving slowly or fidgety/restless - - 0  Suicidal thoughts - - 0  PHQ-9 Score - - 16    Review of Systems  Constitutional: Positive for unexpected weight change.  Neurological: Positive for tremors, weakness and numbness.       Tingling  All other systems reviewed and are negative.  Objective:   Physical Exam  Constitutional: She is oriented to person, place, and time. She appears well-developed and well-nourished.  HENT:  Head: Normocephalic and atraumatic.  Neck: Normal range of motion. Neck supple.  Cardiovascular: Normal rate and regular rhythm.   Pulmonary/Chest: Effort normal and breath sounds normal.  Musculoskeletal:  Normal Muscle Bulk and Muscle Testing Reveals: Upper Extremities: Full ROM and Muscle Strength 5/5 Thoracic Tightness: T-1- T-4 Lower Extremities: Full ROM and Muscle Strength 5/5 Arises from chair with ease Narrow Based Gait   Neurological: She is alert and oriented to person, place, and time.  Skin: Skin is warm and dry.  Psychiatric: She has a normal mood and affect.  Nursing note and vitals reviewed.           Assessment & Plan:  1. 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  Refilled:oxyCODONE  one tablet every 4 hours as needed #120. Alternate heat and ice therapy. Continue to use Voltaren gel and gabapentin   20 minutes of face to face patient care time was spent during this visit. All questions were encouraged and answered.

## 2015-08-02 ENCOUNTER — Telehealth: Payer: Self-pay | Admitting: Registered Nurse

## 2015-08-02 ENCOUNTER — Telehealth: Payer: Self-pay | Admitting: *Deleted

## 2015-08-02 MED ORDER — DIAZEPAM 5 MG PO TABS: 5.0000 mg | ORAL_TABLET | Freq: Two times a day (BID) | ORAL | Status: AC

## 2015-08-02 NOTE — Telephone Encounter (Signed)
I called and spoke with Shirley Hamilton about her request to refill diazepam with refills prior to leaving for her move to [place].  I explained to her that we could not authorize 90 day supply and could not offer refills as they will not be refillable in [place]. I see that her last fill was 07/26/15 and I have explained to her that we will give her one refill and allow pharmacy to fill just prior to move which she says will occur around 08/18/15.  That is the best we can do for her and she will have to establish right away when she gets there.  She says her daughter has already found her a pain center and they will be requesting records.  I wished her well with her move, and notified [facility] we would authorize only the one fill.

## 2015-08-02 NOTE — Telephone Encounter (Signed)
I spoke to Ms. Shirley Hamilton  She was asking for a refill on her Diazapam [facility]. She asked if we will call [facility] and speak to [place]bara. Ms. Shirley Hamilton is concerned due to ger move to [place]. We will place the call to [facility] and Call Ms. Shirley Hamilton with an update she verbalizes understanding.

## 2015-08-02 NOTE — Telephone Encounter (Signed)
Patient would like Rx for xanax as her upcoming move to [place] will be stressful.

## 2016-08-11 NOTE — Progress Notes (Signed)
Our office signed up w/ epic today. Seeing this message for the first time.  Already addressed.
